# Patient Record
Sex: Male | Born: 1955 | Race: White | Hispanic: No | Marital: Married
Health system: Southern US, Community
[De-identification: ages and names within clinical notes are randomized; demographics above are authoritative.]

## PROBLEM LIST (undated history)

## (undated) DIAGNOSIS — I10 Essential (primary) hypertension: Secondary | ICD-10-CM

---

## 2006-04-27 ENCOUNTER — Other Ambulatory Visit: Payer: Self-pay

## 2006-04-27 ENCOUNTER — Inpatient Hospital Stay: Payer: Self-pay | Admitting: Internal Medicine

## 2006-05-10 ENCOUNTER — Other Ambulatory Visit: Payer: Self-pay

## 2006-05-10 ENCOUNTER — Ambulatory Visit: Payer: Self-pay | Admitting: Internal Medicine

## 2008-02-14 ENCOUNTER — Ambulatory Visit: Payer: Self-pay | Admitting: Family Medicine

## 2009-06-24 ENCOUNTER — Ambulatory Visit: Payer: Self-pay | Admitting: Family Medicine

## 2011-04-08 ENCOUNTER — Inpatient Hospital Stay: Payer: Self-pay | Admitting: Internal Medicine

## 2011-09-03 ENCOUNTER — Other Ambulatory Visit (HOSPITAL_COMMUNITY): Payer: Self-pay | Admitting: Family Medicine

## 2011-09-08 ENCOUNTER — Other Ambulatory Visit (HOSPITAL_COMMUNITY): Payer: Self-pay

## 2011-09-17 ENCOUNTER — Other Ambulatory Visit (HOSPITAL_COMMUNITY): Payer: Self-pay

## 2011-09-24 ENCOUNTER — Other Ambulatory Visit (HOSPITAL_COMMUNITY): Payer: Self-pay | Admitting: Family Medicine

## 2011-09-24 ENCOUNTER — Encounter (HOSPITAL_COMMUNITY): Payer: Self-pay

## 2011-09-24 ENCOUNTER — Ambulatory Visit (HOSPITAL_COMMUNITY)
Admission: RE | Admit: 2011-09-24 | Discharge: 2011-09-24 | Disposition: A | Discharge status: Home / Self Care | Payer: BC Managed Care – PPO | Source: Ambulatory Visit | Attending: Family Medicine | Admitting: Family Medicine

## 2011-09-24 DIAGNOSIS — R51 Headache: Secondary | ICD-10-CM | POA: Insufficient documentation

## 2011-09-24 DIAGNOSIS — R55 Syncope and collapse: Secondary | ICD-10-CM | POA: Insufficient documentation

## 2011-09-24 HISTORY — DX: Essential (primary) hypertension: I10

## 2011-09-24 LAB — RENAL FUNCTION PANEL
Calcium: 9.6 mg/dL (ref 8.4–10.5)
GFR calc Af Amer: 69 mL/min — ABNORMAL LOW (ref >90)
GFR calc non Af Amer: 59 mL/min — ABNORMAL LOW (ref >90)
Phosphorus: 3 mg/dL (ref 2.3–4.6)

## 2011-09-24 MED ORDER — IOHEXOL 300 MG/ML  SOLN
80.0000 mL | Freq: Once | INTRAMUSCULAR | Status: AC | PRN
  Administered 2011-09-24: 80 mL via INTRAVENOUS

## 2011-12-23 ENCOUNTER — Inpatient Hospital Stay: Payer: Self-pay | Admitting: Family Medicine

## 2011-12-23 LAB — URINALYSIS, COMPLETE
Bacteria: NONE SEEN
Bilirubin,UR: NEGATIVE
Blood: NEGATIVE
Glucose,UR: NEGATIVE mg/dL (ref 0–75)
Granular Cast: 48
Ketone: NEGATIVE
Nitrite: NEGATIVE
Ph: 5 (ref 4.5–8.0)
Specific Gravity: 1.015 (ref 1.003–1.030)
Squamous Epithelial: <1

## 2011-12-23 LAB — PROTIME-INR
INR: 1.9
Prothrombin Time: 22 secs — ABNORMAL HIGH (ref 11.5–14.7)

## 2011-12-23 LAB — COMPREHENSIVE METABOLIC PANEL
Anion Gap: 14 (ref 7–16)
Calcium, Total: 8.8 mg/dL (ref 8.5–10.1)
Chloride: 101 mmol/L (ref 98–107)
Co2: 21 mmol/L (ref 21–32)
EGFR (Non-African Amer.): 49 — ABNORMAL LOW
Osmolality: 284 (ref 275–301)
Potassium: 4.1 mmol/L (ref 3.5–5.1)
SGOT(AST): 23 U/L (ref 15–37)
Total Protein: 7.3 g/dL (ref 6.4–8.2)

## 2011-12-23 LAB — CBC WITH DIFFERENTIAL/PLATELET
Bands: 1 %
HCT: 44.9 % (ref 40.0–52.0)
Lymphocytes: 12 %
MCH: 27 pg (ref 26.0–34.0)
MCHC: 32.4 g/dL (ref 32.0–36.0)
Platelet: 518 10*3/uL — ABNORMAL HIGH (ref 150–440)
RDW: 14.8 % — ABNORMAL HIGH (ref 11.5–14.5)
Segmented Neutrophils: 76 %
WBC: 19.5 10*3/uL — ABNORMAL HIGH (ref 3.8–10.6)

## 2011-12-23 LAB — TROPONIN I
Troponin-I: <0.02 ng/mL
Troponin-I: <0.02 ng/mL

## 2011-12-23 LAB — APTT: Activated PTT: 42.4 secs — ABNORMAL HIGH (ref 23.6–35.9)

## 2011-12-23 LAB — CK TOTAL AND CKMB (NOT AT ARMC): CK, Total: 649 U/L — ABNORMAL HIGH (ref 35–232)

## 2011-12-23 LAB — PRO B NATRIURETIC PEPTIDE: B-Type Natriuretic Peptide: 1451 pg/mL — ABNORMAL HIGH (ref 0–125)

## 2011-12-24 LAB — TROPONIN I: Troponin-I: <0.02 ng/mL

## 2011-12-24 LAB — COMPREHENSIVE METABOLIC PANEL
Albumin: 2.1 g/dL — ABNORMAL LOW (ref 3.4–5.0)
Alkaline Phosphatase: 91 U/L (ref 50–136)
Anion Gap: 10 (ref 7–16)
Chloride: 100 mmol/L (ref 98–107)
Co2: 26 mmol/L (ref 21–32)
Creatinine: 1.67 mg/dL — ABNORMAL HIGH (ref 0.60–1.30)
EGFR (African American): 52 — ABNORMAL LOW
EGFR (Non-African Amer.): 45 — ABNORMAL LOW
Glucose: 288 mg/dL — ABNORMAL HIGH (ref 65–99)
SGPT (ALT): 25 U/L
Sodium: 136 mmol/L (ref 136–145)
Total Protein: 6.5 g/dL (ref 6.4–8.2)

## 2011-12-24 LAB — CK TOTAL AND CKMB (NOT AT ARMC)
CK, Total: 792 U/L — ABNORMAL HIGH (ref 35–232)
CK-MB: 7.1 ng/mL — ABNORMAL HIGH (ref 0.5–3.6)

## 2011-12-24 LAB — CBC WITH DIFFERENTIAL/PLATELET
Basophil #: 0.3 10*3/uL — ABNORMAL HIGH (ref 0.0–0.1)
Basophil %: 1.5 %
Eosinophil %: 0.8 %
HCT: 41.5 % (ref 40.0–52.0)
HGB: 13.2 g/dL (ref 13.0–18.0)
Lymphocyte #: 1.6 10*3/uL (ref 1.0–3.6)
Lymphocyte %: 8.7 %
MCV: 84 fL (ref 80–100)
Monocyte #: 1.8 x10 3/mm — ABNORMAL HIGH (ref 0.2–1.0)
Monocyte %: 9.8 %
Platelet: 475 10*3/uL — ABNORMAL HIGH (ref 150–440)
RBC: 4.96 10*6/uL (ref 4.40–5.90)
RDW: 14.8 % — ABNORMAL HIGH (ref 11.5–14.5)
WBC: 18.5 10*3/uL — ABNORMAL HIGH (ref 3.8–10.6)

## 2011-12-24 LAB — MAGNESIUM: Magnesium: 1.5 mg/dL — ABNORMAL LOW

## 2011-12-25 LAB — CBC WITH DIFFERENTIAL/PLATELET
Basophil #: 0.3 10*3/uL — ABNORMAL HIGH (ref 0.0–0.1)
Basophil %: 1.3 %
Eosinophil %: 0.9 %
HCT: 41.3 % (ref 40.0–52.0)
HGB: 13.2 g/dL (ref 13.0–18.0)
MCH: 26.8 pg (ref 26.0–34.0)
MCHC: 32 g/dL (ref 32.0–36.0)
MCV: 84 fL (ref 80–100)
Neutrophil #: 16.1 10*3/uL — ABNORMAL HIGH (ref 1.4–6.5)
Platelet: 488 10*3/uL — ABNORMAL HIGH (ref 150–440)
RBC: 4.92 10*6/uL (ref 4.40–5.90)

## 2011-12-25 LAB — BASIC METABOLIC PANEL
Anion Gap: 9 (ref 7–16)
Calcium, Total: 9 mg/dL (ref 8.5–10.1)
Chloride: 98 mmol/L (ref 98–107)
Co2: 28 mmol/L (ref 21–32)
EGFR (African American): 54 — ABNORMAL LOW
EGFR (Non-African Amer.): 47 — ABNORMAL LOW
Glucose: 236 mg/dL — ABNORMAL HIGH (ref 65–99)
Osmolality: 287 (ref 275–301)
Sodium: 135 mmol/L — ABNORMAL LOW (ref 136–145)

## 2011-12-25 LAB — PROTIME-INR: Prothrombin Time: 32 secs — ABNORMAL HIGH (ref 11.5–14.7)

## 2011-12-26 LAB — BASIC METABOLIC PANEL
Anion Gap: 7 (ref 7–16)
Calcium, Total: 8.8 mg/dL (ref 8.5–10.1)
Chloride: 99 mmol/L (ref 98–107)
Co2: 27 mmol/L (ref 21–32)
EGFR (African American): 59 — ABNORMAL LOW
Glucose: 184 mg/dL — ABNORMAL HIGH (ref 65–99)
Potassium: 4.3 mmol/L (ref 3.5–5.1)

## 2011-12-26 LAB — CBC WITH DIFFERENTIAL/PLATELET
Basophil #: 0.2 10*3/uL — ABNORMAL HIGH (ref 0.0–0.1)
Basophil %: 1.1 %
Eosinophil #: 0.3 10*3/uL (ref 0.0–0.7)
Eosinophil %: 1.6 %
HCT: 41.6 % (ref 40.0–52.0)
Lymphocyte #: 1.6 10*3/uL (ref 1.0–3.6)
MCH: 26.5 pg (ref 26.0–34.0)
MCV: 83 fL (ref 80–100)
Monocyte #: 2.1 x10 3/mm — ABNORMAL HIGH (ref 0.2–1.0)
Neutrophil #: 16.1 10*3/uL — ABNORMAL HIGH (ref 1.4–6.5)
Platelet: 524 10*3/uL — ABNORMAL HIGH (ref 150–440)
RBC: 4.99 10*6/uL (ref 4.40–5.90)
RDW: 14.5 % (ref 11.5–14.5)

## 2011-12-26 LAB — PROTIME-INR
INR: 3
Prothrombin Time: 31.5 secs — ABNORMAL HIGH (ref 11.5–14.7)

## 2011-12-26 LAB — VANCOMYCIN, TROUGH: Vancomycin, Trough: 17 ug/mL (ref 10–20)

## 2011-12-27 LAB — BASIC METABOLIC PANEL
Anion Gap: 8 (ref 7–16)
Calcium, Total: 8.5 mg/dL (ref 8.5–10.1)
Chloride: 96 mmol/L — ABNORMAL LOW (ref 98–107)
Co2: 26 mmol/L (ref 21–32)
Creatinine: 1.59 mg/dL — ABNORMAL HIGH (ref 0.60–1.30)
Glucose: 247 mg/dL — ABNORMAL HIGH (ref 65–99)
Osmolality: 279 (ref 275–301)
Potassium: 4.5 mmol/L (ref 3.5–5.1)

## 2011-12-27 LAB — CBC WITH DIFFERENTIAL/PLATELET
Basophil %: 1.4 %
Eosinophil %: 1.6 %
HGB: 12.9 g/dL — ABNORMAL LOW (ref 13.0–18.0)
Lymphocyte #: 1.3 10*3/uL (ref 1.0–3.6)
MCH: 26.6 pg (ref 26.0–34.0)
MCV: 83 fL (ref 80–100)
Monocyte #: 1.8 x10 3/mm — ABNORMAL HIGH (ref 0.2–1.0)
Monocyte %: 9.8 %
Neutrophil #: 15.3 10*3/uL — ABNORMAL HIGH (ref 1.4–6.5)
Neutrophil %: 80.5 %
Platelet: 517 10*3/uL — ABNORMAL HIGH (ref 150–440)

## 2011-12-27 LAB — OSMOLALITY, URINE: Osmolality: 413 mOsm/kg

## 2011-12-27 LAB — PROTIME-INR
INR: 2.3
Prothrombin Time: 25.3 secs — ABNORMAL HIGH (ref 11.5–14.7)

## 2011-12-28 LAB — CBC WITH DIFFERENTIAL/PLATELET
Eosinophil %: 1.9 %
HCT: 38.6 % — ABNORMAL LOW (ref 40.0–52.0)
Lymphocyte #: 1.3 10*3/uL (ref 1.0–3.6)
MCV: 83 fL (ref 80–100)
Monocyte %: 9.9 %
Neutrophil %: 80.5 %
Platelet: 552 10*3/uL — ABNORMAL HIGH (ref 150–440)
RBC: 4.66 10*6/uL (ref 4.40–5.90)
WBC: 19.7 10*3/uL — ABNORMAL HIGH (ref 3.8–10.6)

## 2011-12-28 LAB — BASIC METABOLIC PANEL
Anion Gap: 10 (ref 7–16)
Calcium, Total: 8.7 mg/dL (ref 8.5–10.1)
Chloride: 95 mmol/L — ABNORMAL LOW (ref 98–107)
Creatinine: 1.53 mg/dL — ABNORMAL HIGH (ref 0.60–1.30)
EGFR (African American): 58 — ABNORMAL LOW
EGFR (Non-African Amer.): 50 — ABNORMAL LOW
Glucose: 228 mg/dL — ABNORMAL HIGH (ref 65–99)
Osmolality: 277 (ref 275–301)
Potassium: 4.9 mmol/L (ref 3.5–5.1)
Sodium: 129 mmol/L — ABNORMAL LOW (ref 136–145)

## 2011-12-28 LAB — PROTIME-INR: INR: 1.8

## 2011-12-28 LAB — CULTURE, BLOOD (SINGLE)

## 2011-12-29 LAB — CBC WITH DIFFERENTIAL/PLATELET
Basophil #: 0.2 10*3/uL — ABNORMAL HIGH (ref 0.0–0.1)
HGB: 12.1 g/dL — ABNORMAL LOW (ref 13.0–18.0)
Lymphocyte #: 1.2 10*3/uL (ref 1.0–3.6)
Lymphocyte %: 6.1 %
MCH: 27.1 pg (ref 26.0–34.0)
MCV: 83 fL (ref 80–100)
Monocyte %: 9.7 %
Neutrophil #: 15.5 10*3/uL — ABNORMAL HIGH (ref 1.4–6.5)
Platelet: 550 10*3/uL — ABNORMAL HIGH (ref 150–440)
RBC: 4.46 10*6/uL (ref 4.40–5.90)
WBC: 19 10*3/uL — ABNORMAL HIGH (ref 3.8–10.6)

## 2011-12-29 LAB — MAGNESIUM: Magnesium: 2.1 mg/dL

## 2011-12-29 LAB — PROTIME-INR
INR: 1.9
Prothrombin Time: 22.3 secs — ABNORMAL HIGH (ref 11.5–14.7)

## 2011-12-29 LAB — CREATININE, SERUM: Creatinine: 1.45 mg/dL — ABNORMAL HIGH (ref 0.60–1.30)

## 2011-12-30 LAB — CBC WITH DIFFERENTIAL/PLATELET
Basophil %: 0.7 %
Eosinophil #: 0.3 10*3/uL (ref 0.0–0.7)
HGB: 11.9 g/dL — ABNORMAL LOW (ref 13.0–18.0)
Lymphocyte #: 1.5 10*3/uL (ref 1.0–3.6)
Lymphocyte %: 7.5 %
MCH: 26.4 pg (ref 26.0–34.0)
MCHC: 31.6 g/dL — ABNORMAL LOW (ref 32.0–36.0)
MCV: 83 fL (ref 80–100)
Monocyte %: 10.2 %
Neutrophil #: 15.5 10*3/uL — ABNORMAL HIGH (ref 1.4–6.5)
RDW: 14.9 % — ABNORMAL HIGH (ref 11.5–14.5)
WBC: 19.4 10*3/uL — ABNORMAL HIGH (ref 3.8–10.6)

## 2011-12-30 LAB — PROTIME-INR
INR: 2
Prothrombin Time: 23.1 secs — ABNORMAL HIGH (ref 11.5–14.7)

## 2012-12-25 ENCOUNTER — Inpatient Hospital Stay: Payer: Self-pay | Admitting: Internal Medicine

## 2012-12-25 LAB — URINALYSIS, COMPLETE
Bacteria: NONE SEEN
Bilirubin,UR: NEGATIVE
Glucose,UR: >=500 mg/dL (ref 0–75)
Hyaline Cast: 8
Leukocyte Esterase: NEGATIVE
Nitrite: NEGATIVE
Ph: 5 (ref 4.5–8.0)
Specific Gravity: 1.026 (ref 1.003–1.030)
Squamous Epithelial: <1
WBC UR: 10 /HPF (ref 0–5)

## 2012-12-25 LAB — PROTIME-INR: INR: 1.8

## 2012-12-25 LAB — CBC: WBC: 18.6 10*3/uL — ABNORMAL HIGH (ref 3.8–10.6)

## 2012-12-25 LAB — COMPREHENSIVE METABOLIC PANEL
Albumin: 2.1 g/dL — ABNORMAL LOW (ref 3.4–5.0)
Alkaline Phosphatase: 117 U/L (ref 50–136)
BUN: 21 mg/dL — ABNORMAL HIGH (ref 7–18)
Bilirubin,Total: 0.4 mg/dL (ref 0.2–1.0)
Calcium, Total: 9 mg/dL (ref 8.5–10.1)
Chloride: 101 mmol/L (ref 98–107)
Co2: 30 mmol/L (ref 21–32)
Creatinine: 1.41 mg/dL — ABNORMAL HIGH (ref 0.60–1.30)
EGFR (African American): >60
Osmolality: 286 (ref 275–301)
Potassium: 4 mmol/L (ref 3.5–5.1)
SGOT(AST): 12 U/L — ABNORMAL LOW (ref 15–37)
Total Protein: 7 g/dL (ref 6.4–8.2)

## 2012-12-25 LAB — PRO B NATRIURETIC PEPTIDE: B-Type Natriuretic Peptide: 5083 pg/mL — ABNORMAL HIGH (ref 0–125)

## 2012-12-26 LAB — BASIC METABOLIC PANEL
Anion Gap: 4 — ABNORMAL LOW (ref 7–16)
Co2: 33 mmol/L — ABNORMAL HIGH (ref 21–32)
EGFR (African American): >60
Potassium: 4 mmol/L (ref 3.5–5.1)

## 2012-12-26 LAB — CBC WITH DIFFERENTIAL/PLATELET
Basophil #: 0.2 10*3/uL — ABNORMAL HIGH (ref 0.0–0.1)
Eosinophil #: 0.1 10*3/uL (ref 0.0–0.7)
HGB: 13 g/dL (ref 13.0–18.0)
Lymphocyte #: 1.5 10*3/uL (ref 1.0–3.6)
Lymphocyte %: 7 %
Neutrophil #: 18 10*3/uL — ABNORMAL HIGH (ref 1.4–6.5)
Neutrophil %: 84.6 %
Platelet: 500 10*3/uL — ABNORMAL HIGH (ref 150–440)
RBC: 5.38 10*6/uL (ref 4.40–5.90)
WBC: 21.3 10*3/uL — ABNORMAL HIGH (ref 3.8–10.6)

## 2012-12-26 LAB — MAGNESIUM: Magnesium: 1.5 mg/dL — ABNORMAL LOW

## 2012-12-26 LAB — HEMOGLOBIN A1C: Hemoglobin A1C: 11.6 % — ABNORMAL HIGH (ref 4.2–6.3)

## 2012-12-27 LAB — CREATININE, SERUM
Creatinine: 2.78 mg/dL — ABNORMAL HIGH (ref 0.60–1.30)
EGFR (African American): 28 — ABNORMAL LOW
EGFR (Non-African Amer.): 24 — ABNORMAL LOW

## 2012-12-28 LAB — BASIC METABOLIC PANEL
Anion Gap: 6 — ABNORMAL LOW (ref 7–16)
Chloride: 95 mmol/L — ABNORMAL LOW (ref 98–107)
EGFR (African American): 18 — ABNORMAL LOW
EGFR (Non-African Amer.): 15 — ABNORMAL LOW
Glucose: 258 mg/dL — ABNORMAL HIGH (ref 65–99)
Osmolality: 283 (ref 275–301)
Potassium: 4.6 mmol/L (ref 3.5–5.1)
Sodium: 131 mmol/L — ABNORMAL LOW (ref 136–145)

## 2012-12-28 LAB — CBC WITH DIFFERENTIAL/PLATELET
Basophil #: 0.1 10*3/uL (ref 0.0–0.1)
Basophil %: 0.7 %
Eosinophil #: 0.5 10*3/uL (ref 0.0–0.7)
HGB: 11.9 g/dL — ABNORMAL LOW (ref 13.0–18.0)
Lymphocyte %: 4.4 %
MCH: 24.5 pg — ABNORMAL LOW (ref 26.0–34.0)
MCHC: 31.8 g/dL — ABNORMAL LOW (ref 32.0–36.0)
MCV: 77 fL — ABNORMAL LOW (ref 80–100)
Neutrophil #: 15.9 10*3/uL — ABNORMAL HIGH (ref 1.4–6.5)
Neutrophil %: 84.3 %
Platelet: 501 10*3/uL — ABNORMAL HIGH (ref 150–440)
RDW: 16.3 % — ABNORMAL HIGH (ref 11.5–14.5)

## 2012-12-28 LAB — VANCOMYCIN, TROUGH: Vancomycin, Trough: 22 ug/mL (ref 10–20)

## 2012-12-28 LAB — PROTIME-INR
INR: 1.6
Prothrombin Time: 19.3 secs — ABNORMAL HIGH (ref 11.5–14.7)

## 2012-12-28 LAB — PROTEIN / CREATININE RATIO, URINE
Creatinine, Urine: 174.1 mg/dL — ABNORMAL HIGH (ref 30.0–125.0)
Protein, Random Urine: 75 mg/dL — ABNORMAL HIGH (ref 0–12)

## 2012-12-28 LAB — URINALYSIS, COMPLETE
Bilirubin,UR: NEGATIVE
Ph: 5 (ref 4.5–8.0)
RBC,UR: 6 /HPF (ref 0–5)
Specific Gravity: 1.017 (ref 1.003–1.030)
WBC UR: 82 /HPF (ref 0–5)

## 2012-12-28 LAB — MAGNESIUM: Magnesium: 2.1 mg/dL

## 2012-12-29 LAB — BASIC METABOLIC PANEL
Co2: 23 mmol/L (ref 21–32)
Creatinine: 4.32 mg/dL — ABNORMAL HIGH (ref 0.60–1.30)
EGFR (African American): 16 — ABNORMAL LOW
EGFR (Non-African Amer.): 14 — ABNORMAL LOW
Glucose: 232 mg/dL — ABNORMAL HIGH (ref 65–99)
Sodium: 134 mmol/L — ABNORMAL LOW (ref 136–145)

## 2012-12-29 LAB — PROTIME-INR: INR: 1.4

## 2012-12-29 LAB — VANCOMYCIN, TROUGH
Vancomycin, Trough: 23 ug/mL (ref 10–20)
Vancomycin, Trough: 23 ug/mL (ref 10–20)

## 2012-12-30 LAB — CULTURE, BLOOD (SINGLE)

## 2012-12-30 LAB — CREATININE, SERUM
Creatinine: 4.64 mg/dL — ABNORMAL HIGH (ref 0.60–1.30)
EGFR (African American): 15 — ABNORMAL LOW

## 2012-12-30 LAB — PROTIME-INR: Prothrombin Time: 16.9 secs — ABNORMAL HIGH (ref 11.5–14.7)

## 2012-12-31 LAB — BASIC METABOLIC PANEL
Anion Gap: 5 — ABNORMAL LOW (ref 7–16)
Calcium, Total: 8.6 mg/dL (ref 8.5–10.1)
Co2: 29 mmol/L (ref 21–32)
Creatinine: 4.64 mg/dL — ABNORMAL HIGH (ref 0.60–1.30)
Osmolality: 289 (ref 275–301)
Potassium: 4.8 mmol/L (ref 3.5–5.1)

## 2013-01-01 LAB — BASIC METABOLIC PANEL
Anion Gap: 7 (ref 7–16)
BUN: 68 mg/dL — ABNORMAL HIGH (ref 7–18)
Calcium, Total: 8.8 mg/dL (ref 8.5–10.1)
Chloride: 98 mmol/L (ref 98–107)
Co2: 27 mmol/L (ref 21–32)
EGFR (African American): 14 — ABNORMAL LOW
Glucose: 205 mg/dL — ABNORMAL HIGH (ref 65–99)
Osmolality: 290 (ref 275–301)
Potassium: 5.1 mmol/L (ref 3.5–5.1)
Sodium: 132 mmol/L — ABNORMAL LOW (ref 136–145)

## 2013-01-01 LAB — PROTIME-INR: Prothrombin Time: 17 secs — ABNORMAL HIGH (ref 11.5–14.7)

## 2013-01-02 LAB — RENAL FUNCTION PANEL
Albumin: 1.5 g/dL — ABNORMAL LOW (ref 3.4–5.0)
Anion Gap: 6 — ABNORMAL LOW (ref 7–16)
Calcium, Total: 8.2 mg/dL — ABNORMAL LOW (ref 8.5–10.1)
Calcium, Total: 8.7 mg/dL (ref 8.5–10.1)
Chloride: 99 mmol/L (ref 98–107)
Chloride: 99 mmol/L (ref 98–107)
Co2: 25 mmol/L (ref 21–32)
Co2: 27 mmol/L (ref 21–32)
Creatinine: 4.94 mg/dL — ABNORMAL HIGH (ref 0.60–1.30)
Creatinine: 4.93 mg/dL — ABNORMAL HIGH (ref 0.60–1.30)
EGFR (African American): 14 — ABNORMAL LOW
EGFR (Non-African Amer.): 12 — ABNORMAL LOW
Glucose: 204 mg/dL — ABNORMAL HIGH (ref 65–99)
Glucose: 253 mg/dL — ABNORMAL HIGH (ref 65–99)
Phosphorus: 6.2 mg/dL — ABNORMAL HIGH (ref 2.5–4.9)
Phosphorus: 5.5 mg/dL — ABNORMAL HIGH (ref 2.5–4.9)
Potassium: 5.1 mmol/L (ref 3.5–5.1)

## 2013-01-02 LAB — CREATININE, SERUM: Creatinine: 5.03 mg/dL — ABNORMAL HIGH (ref 0.60–1.30)

## 2013-01-03 LAB — CBC WITH DIFFERENTIAL/PLATELET
Basophil #: 0.2 10*3/uL — ABNORMAL HIGH (ref 0.0–0.1)
Basophil %: 1.3 %
Eosinophil #: 0.5 10*3/uL (ref 0.0–0.7)
HGB: 11.4 g/dL — ABNORMAL LOW (ref 13.0–18.0)
Lymphocyte #: 0.8 10*3/uL — ABNORMAL LOW (ref 1.0–3.6)
MCV: 77 fL — ABNORMAL LOW (ref 80–100)
Monocyte #: 1 x10 3/mm (ref 0.2–1.0)
Monocyte %: 7.4 %
Neutrophil %: 81.7 %
Platelet: 445 10*3/uL — ABNORMAL HIGH (ref 150–440)
RBC: 4.75 10*6/uL (ref 4.40–5.90)
WBC: 12.9 10*3/uL — ABNORMAL HIGH (ref 3.8–10.6)

## 2013-01-03 LAB — PROTIME-INR
INR: 1.8
Prothrombin Time: 20.4 secs — ABNORMAL HIGH (ref 11.5–14.7)

## 2013-01-03 LAB — RENAL FUNCTION PANEL
Albumin: 1.5 g/dL — ABNORMAL LOW (ref 3.4–5.0)
BUN: 71 mg/dL — ABNORMAL HIGH (ref 7–18)
Calcium, Total: 8.7 mg/dL (ref 8.5–10.1)
Chloride: 99 mmol/L (ref 98–107)
Co2: 28 mmol/L (ref 21–32)
Creatinine: 5.01 mg/dL — ABNORMAL HIGH (ref 0.60–1.30)
EGFR (African American): 14 — ABNORMAL LOW
EGFR (Non-African Amer.): 12 — ABNORMAL LOW
Glucose: 197 mg/dL — ABNORMAL HIGH (ref 65–99)
Osmolality: 291 (ref 275–301)
Potassium: 5.3 mmol/L — ABNORMAL HIGH (ref 3.5–5.1)

## 2013-01-03 LAB — SEDIMENTATION RATE: Erythrocyte Sed Rate: 11 mm/hr (ref 0–20)

## 2013-01-04 LAB — RENAL FUNCTION PANEL
Albumin: 1.5 g/dL — ABNORMAL LOW (ref 3.4–5.0)
Anion Gap: 6 — ABNORMAL LOW (ref 7–16)
Calcium, Total: 8.7 mg/dL (ref 8.5–10.1)
Chloride: 101 mmol/L (ref 98–107)
Co2: 26 mmol/L (ref 21–32)
EGFR (African American): 15 — ABNORMAL LOW
EGFR (Non-African Amer.): 13 — ABNORMAL LOW
Glucose: 249 mg/dL — ABNORMAL HIGH (ref 65–99)
Osmolality: 297 (ref 275–301)
Potassium: 5.4 mmol/L — ABNORMAL HIGH (ref 3.5–5.1)

## 2013-01-04 LAB — CLOSTRIDIUM DIFFICILE BY PCR

## 2013-01-05 LAB — BASIC METABOLIC PANEL
Anion Gap: 6 — ABNORMAL LOW (ref 7–16)
BUN: 75 mg/dL — ABNORMAL HIGH (ref 7–18)
Calcium, Total: 8.9 mg/dL (ref 8.5–10.1)
Co2: 26 mmol/L (ref 21–32)
Glucose: 152 mg/dL — ABNORMAL HIGH (ref 65–99)

## 2013-01-05 LAB — PROTIME-INR: Prothrombin Time: 23 secs — ABNORMAL HIGH (ref 11.5–14.7)

## 2013-01-06 LAB — BASIC METABOLIC PANEL
Anion Gap: 7 (ref 7–16)
BUN: 80 mg/dL — ABNORMAL HIGH (ref 7–18)
Co2: 27 mmol/L (ref 21–32)
EGFR (African American): 15 — ABNORMAL LOW
EGFR (Non-African Amer.): 13 — ABNORMAL LOW
Osmolality: 301 (ref 275–301)
Potassium: 5.4 mmol/L — ABNORMAL HIGH (ref 3.5–5.1)
Sodium: 137 mmol/L (ref 136–145)

## 2013-01-06 LAB — PROTIME-INR: Prothrombin Time: 25.5 secs — ABNORMAL HIGH (ref 11.5–14.7)

## 2013-01-07 LAB — BASIC METABOLIC PANEL
Anion Gap: 6 — ABNORMAL LOW (ref 7–16)
BUN: 79 mg/dL — ABNORMAL HIGH (ref 7–18)
Chloride: 104 mmol/L (ref 98–107)
Creatinine: 4.4 mg/dL — ABNORMAL HIGH (ref 0.60–1.30)
EGFR (African American): 16 — ABNORMAL LOW
Glucose: 190 mg/dL — ABNORMAL HIGH (ref 65–99)
Osmolality: 303 (ref 275–301)
Potassium: 5.2 mmol/L — ABNORMAL HIGH (ref 3.5–5.1)

## 2013-01-07 LAB — PROTIME-INR: Prothrombin Time: 28.6 secs — ABNORMAL HIGH (ref 11.5–14.7)

## 2013-01-08 LAB — CBC WITH DIFFERENTIAL/PLATELET
Eosinophil #: 0.4 10*3/uL (ref 0.0–0.7)
HGB: 11.2 g/dL — ABNORMAL LOW (ref 13.0–18.0)
Lymphocyte #: 1 10*3/uL (ref 1.0–3.6)
Lymphocyte %: 8.9 %
MCH: 24.2 pg — ABNORMAL LOW (ref 26.0–34.0)
MCHC: 31.5 g/dL — ABNORMAL LOW (ref 32.0–36.0)
MCV: 77 fL — ABNORMAL LOW (ref 80–100)
Monocyte %: 9 %
Neutrophil %: 77.3 %
Platelet: 391 10*3/uL (ref 150–440)
RBC: 4.64 10*6/uL (ref 4.40–5.90)
WBC: 11 10*3/uL — ABNORMAL HIGH (ref 3.8–10.6)

## 2013-01-08 LAB — PROTIME-INR
INR: 3.1
Prothrombin Time: 31.1 secs — ABNORMAL HIGH (ref 11.5–14.7)

## 2013-01-08 LAB — BASIC METABOLIC PANEL
Anion Gap: 4 — ABNORMAL LOW (ref 7–16)
BUN: 71 mg/dL — ABNORMAL HIGH (ref 7–18)
Calcium, Total: 8.5 mg/dL (ref 8.5–10.1)
Co2: 29 mmol/L (ref 21–32)
Creatinine: 4.17 mg/dL — ABNORMAL HIGH (ref 0.60–1.30)
EGFR (Non-African Amer.): 15 — ABNORMAL LOW
Osmolality: 301 (ref 275–301)
Potassium: 5 mmol/L (ref 3.5–5.1)

## 2013-01-09 LAB — MAGNESIUM: Magnesium: 1.6 mg/dL — ABNORMAL LOW

## 2013-01-09 LAB — BASIC METABOLIC PANEL
Anion Gap: 7 (ref 7–16)
BUN: 68 mg/dL — ABNORMAL HIGH (ref 7–18)
Chloride: 105 mmol/L (ref 98–107)
Co2: 27 mmol/L (ref 21–32)
Glucose: 138 mg/dL — ABNORMAL HIGH (ref 65–99)
Osmolality: 299 (ref 275–301)
Potassium: 4.8 mmol/L (ref 3.5–5.1)
Sodium: 139 mmol/L (ref 136–145)

## 2013-01-09 LAB — PROTIME-INR
INR: 4.4
Prothrombin Time: 39.9 secs — ABNORMAL HIGH (ref 11.5–14.7)

## 2013-01-10 LAB — BASIC METABOLIC PANEL
Creatinine: 3.45 mg/dL — ABNORMAL HIGH (ref 0.60–1.30)
EGFR (African American): 22 — ABNORMAL LOW
EGFR (Non-African Amer.): 19 — ABNORMAL LOW
Glucose: 115 mg/dL — ABNORMAL HIGH (ref 65–99)
Osmolality: 296 (ref 275–301)

## 2013-01-10 LAB — MAGNESIUM: Magnesium: 1.8 mg/dL

## 2013-01-11 LAB — BASIC METABOLIC PANEL
Anion Gap: 6 — ABNORMAL LOW (ref 7–16)
BUN: 56 mg/dL — ABNORMAL HIGH (ref 7–18)
Calcium, Total: 8.8 mg/dL (ref 8.5–10.1)
Chloride: 106 mmol/L (ref 98–107)
Creatinine: 3.24 mg/dL — ABNORMAL HIGH (ref 0.60–1.30)

## 2013-01-11 LAB — PROTIME-INR: INR: 3.9

## 2013-01-12 LAB — BASIC METABOLIC PANEL
Anion Gap: 3 — ABNORMAL LOW (ref 7–16)
BUN: 51 mg/dL — ABNORMAL HIGH (ref 7–18)
Calcium, Total: 8.7 mg/dL (ref 8.5–10.1)
Chloride: 105 mmol/L (ref 98–107)
Creatinine: 2.92 mg/dL — ABNORMAL HIGH (ref 0.60–1.30)
EGFR (African American): 26 — ABNORMAL LOW
EGFR (Non-African Amer.): 23 — ABNORMAL LOW
Osmolality: 293 (ref 275–301)
Sodium: 139 mmol/L (ref 136–145)

## 2013-10-02 ENCOUNTER — Inpatient Hospital Stay: Payer: Self-pay | Admitting: Internal Medicine

## 2013-10-02 LAB — URINALYSIS, COMPLETE
Bilirubin,UR: NEGATIVE
Glucose,UR: NEGATIVE mg/dL (ref 0–75)
Hyaline Cast: 2
Ketone: NEGATIVE
Nitrite: POSITIVE
Ph: 5 (ref 4.5–8.0)
Protein: 100
RBC,UR: 1 /HPF (ref 0–5)
Specific Gravity: 1.013 (ref 1.003–1.030)
Squamous Epithelial: NONE SEEN
WBC UR: 22 /HPF (ref 0–5)

## 2013-10-02 LAB — CBC
HCT: 40.4 % (ref 40.0–52.0)
HGB: 12.6 g/dL — ABNORMAL LOW (ref 13.0–18.0)
MCH: 25.2 pg — AB (ref 26.0–34.0)
MCHC: 31.2 g/dL — AB (ref 32.0–36.0)
MCV: 81 fL (ref 80–100)
Platelet: 332 10*3/uL (ref 150–440)
RBC: 5.01 10*6/uL (ref 4.40–5.90)
RDW: 16.5 % — ABNORMAL HIGH (ref 11.5–14.5)
WBC: 8.7 10*3/uL (ref 3.8–10.6)

## 2013-10-02 LAB — COMPREHENSIVE METABOLIC PANEL
ALK PHOS: 155 U/L — AB
ALT: 18 U/L (ref 12–78)
Albumin: 2.8 g/dL — ABNORMAL LOW (ref 3.4–5.0)
Anion Gap: 3 — ABNORMAL LOW (ref 7–16)
BILIRUBIN TOTAL: 1 mg/dL (ref 0.2–1.0)
BUN: 21 mg/dL — ABNORMAL HIGH (ref 7–18)
CALCIUM: 8.9 mg/dL (ref 8.5–10.1)
CHLORIDE: 107 mmol/L (ref 98–107)
Co2: 33 mmol/L — ABNORMAL HIGH (ref 21–32)
Creatinine: 1.54 mg/dL — ABNORMAL HIGH (ref 0.60–1.30)
EGFR (African American): 57 — ABNORMAL LOW
GFR CALC NON AF AMER: 49 — AB
Glucose: 165 mg/dL — ABNORMAL HIGH (ref 65–99)
OSMOLALITY: 292 (ref 275–301)
Potassium: 4.1 mmol/L (ref 3.5–5.1)
SGOT(AST): 22 U/L (ref 15–37)
SODIUM: 143 mmol/L (ref 136–145)
Total Protein: 7.4 g/dL (ref 6.4–8.2)

## 2013-10-02 LAB — PRO B NATRIURETIC PEPTIDE: B-TYPE NATIURETIC PEPTID: 8694 pg/mL — AB (ref 0–125)

## 2013-10-02 LAB — PROTIME-INR
INR: 1.2
PROTHROMBIN TIME: 14.7 s (ref 11.5–14.7)

## 2013-10-02 LAB — TROPONIN I

## 2013-10-03 DIAGNOSIS — I369 Nonrheumatic tricuspid valve disorder, unspecified: Secondary | ICD-10-CM

## 2013-10-03 LAB — BASIC METABOLIC PANEL
ANION GAP: 3 — AB (ref 7–16)
BUN: 21 mg/dL — ABNORMAL HIGH (ref 7–18)
CALCIUM: 8.9 mg/dL (ref 8.5–10.1)
CHLORIDE: 106 mmol/L (ref 98–107)
CO2: 33 mmol/L — AB (ref 21–32)
Creatinine: 1.37 mg/dL — ABNORMAL HIGH (ref 0.60–1.30)
EGFR (Non-African Amer.): 56 — ABNORMAL LOW
Glucose: 102 mg/dL — ABNORMAL HIGH (ref 65–99)
Osmolality: 286 (ref 275–301)
POTASSIUM: 4.2 mmol/L (ref 3.5–5.1)
Sodium: 142 mmol/L (ref 136–145)

## 2013-10-03 LAB — CBC WITH DIFFERENTIAL/PLATELET
BASOS ABS: 0.1 10*3/uL (ref 0.0–0.1)
Basophil %: 1.3 %
EOS ABS: 0.6 10*3/uL (ref 0.0–0.7)
EOS PCT: 5.7 %
HCT: 40 % (ref 40.0–52.0)
HGB: 12.5 g/dL — ABNORMAL LOW (ref 13.0–18.0)
LYMPHS PCT: 9.6 %
Lymphocyte #: 1.1 10*3/uL (ref 1.0–3.6)
MCH: 25.7 pg — ABNORMAL LOW (ref 26.0–34.0)
MCHC: 31.3 g/dL — AB (ref 32.0–36.0)
MCV: 82 fL (ref 80–100)
Monocyte #: 1 x10 3/mm (ref 0.2–1.0)
Monocyte %: 8.8 %
NEUTROS ABS: 8.4 10*3/uL — AB (ref 1.4–6.5)
Neutrophil %: 74.6 %
PLATELETS: 353 10*3/uL (ref 150–440)
RBC: 4.87 10*6/uL (ref 4.40–5.90)
RDW: 16.5 % — ABNORMAL HIGH (ref 11.5–14.5)
WBC: 11.2 10*3/uL — AB (ref 3.8–10.6)

## 2013-10-03 LAB — LIPID PANEL
CHOLESTEROL: 97 mg/dL (ref 0–200)
HDL: 34 mg/dL — AB (ref 40–60)
Ldl Cholesterol, Calc: 49 mg/dL (ref 0–100)
Triglycerides: 68 mg/dL (ref 0–200)
VLDL Cholesterol, Calc: 14 mg/dL (ref 5–40)

## 2013-10-03 LAB — TSH: Thyroid Stimulating Horm: 2.04 u[IU]/mL

## 2013-10-04 LAB — BASIC METABOLIC PANEL
ANION GAP: 4 — AB (ref 7–16)
BUN: 23 mg/dL — ABNORMAL HIGH (ref 7–18)
Calcium, Total: 8.9 mg/dL (ref 8.5–10.1)
Chloride: 103 mmol/L (ref 98–107)
Co2: 34 mmol/L — ABNORMAL HIGH (ref 21–32)
Creatinine: 1.7 mg/dL — ABNORMAL HIGH (ref 0.60–1.30)
EGFR (African American): 50 — ABNORMAL LOW
GFR CALC NON AF AMER: 43 — AB
GLUCOSE: 125 mg/dL — AB (ref 65–99)
OSMOLALITY: 286 (ref 275–301)
POTASSIUM: 4 mmol/L (ref 3.5–5.1)
Sodium: 141 mmol/L (ref 136–145)

## 2013-10-05 LAB — BASIC METABOLIC PANEL
ANION GAP: 5 — AB (ref 7–16)
BUN: 24 mg/dL — AB (ref 7–18)
CALCIUM: 8.9 mg/dL (ref 8.5–10.1)
CO2: 34 mmol/L — AB (ref 21–32)
CREATININE: 1.7 mg/dL — AB (ref 0.60–1.30)
Chloride: 100 mmol/L (ref 98–107)
EGFR (Non-African Amer.): 43 — ABNORMAL LOW
GFR CALC AF AMER: 50 — AB
GLUCOSE: 85 mg/dL (ref 65–99)
Osmolality: 281 (ref 275–301)
Potassium: 3.5 mmol/L (ref 3.5–5.1)
Sodium: 139 mmol/L (ref 136–145)

## 2013-10-06 LAB — BASIC METABOLIC PANEL
Anion Gap: 4 — ABNORMAL LOW (ref 7–16)
BUN: 21 mg/dL — ABNORMAL HIGH (ref 7–18)
CALCIUM: 9 mg/dL (ref 8.5–10.1)
CHLORIDE: 97 mmol/L — AB (ref 98–107)
CREATININE: 1.53 mg/dL — AB (ref 0.60–1.30)
Co2: 39 mmol/L — ABNORMAL HIGH (ref 21–32)
EGFR (African American): 57 — ABNORMAL LOW
EGFR (Non-African Amer.): 49 — ABNORMAL LOW
Glucose: 96 mg/dL (ref 65–99)
OSMOLALITY: 282 (ref 275–301)
Potassium: 3.2 mmol/L — ABNORMAL LOW (ref 3.5–5.1)
SODIUM: 140 mmol/L (ref 136–145)

## 2013-10-07 LAB — BASIC METABOLIC PANEL
Anion Gap: 3 — ABNORMAL LOW (ref 7–16)
BUN: 20 mg/dL — ABNORMAL HIGH (ref 7–18)
CHLORIDE: 92 mmol/L — AB (ref 98–107)
CREATININE: 1.44 mg/dL — AB (ref 0.60–1.30)
Calcium, Total: 9.1 mg/dL (ref 8.5–10.1)
Co2: 43 mmol/L (ref 21–32)
EGFR (African American): >60
EGFR (Non-African Amer.): 53 — ABNORMAL LOW
GLUCOSE: 169 mg/dL — AB (ref 65–99)
Osmolality: 282 (ref 275–301)
POTASSIUM: 3.6 mmol/L (ref 3.5–5.1)
Sodium: 138 mmol/L (ref 136–145)

## 2013-10-07 LAB — MAGNESIUM: Magnesium: 1.2 mg/dL — ABNORMAL LOW

## 2013-10-07 LAB — CULTURE, BLOOD (SINGLE)

## 2013-10-08 LAB — MAGNESIUM: MAGNESIUM: 1.7 mg/dL — AB

## 2013-10-08 LAB — CREATININE, SERUM: Creatinine: 1.24 mg/dL (ref 0.60–1.30)

## 2013-10-08 LAB — POTASSIUM: Potassium: 3.3 mmol/L — ABNORMAL LOW (ref 3.5–5.1)

## 2014-07-23 DEATH — deceased

## 2014-09-10 NOTE — Discharge Summary (Signed)
PATIENT NAME:  Brent [name], Brent [name] MR#:  [id number] DATE OF BIRTH:  27-Jun-1955  DATE OF ADMISSION:  12/23/2011 DATE OF DISCHARGE:    REASON FOR ADMISSION: Weakness, unable to walk, swollen testicles, and difficulty voiding.    FINAL DISCHARGE DIAGNOSIS: Epididymoorchitis.  OTHER ASSOCIATED DIAGNOSES WITH THIS ADMISSION:  1. Acute prostatitis with scrotal cellulitis.  2. Urinary obstruction requiring Foley catheter.  3. Atrial fibrillation.  4. RVR treated with IV metoprolol and resolved.  5. Acute renal failure.  6. Chronic kidney disease, stage III. 7. SIRS.  8. Mild rhabdomyolysis, now improved.  9. Insulin dependent diabetes, uncontrolled.  10. Morbid obesity.  11. Hypertension.  12. Generalized weakness.   DISPOSITION: Home.   CONDITION AT DISCHARGE: Good.   FOLLOW-UP:  1. Follow-up with Dr. Leavy Cella of ID in 2 to 3 weeks.  2. Follow-up with Dr. Sherrie Mustache, the patient's primary care physician, in 2 to 3 weeks.   MEDICATIONS AT DISCHARGE: 1. Levofloxacin 750 mg 1 p.o. daily for 2 to 3 weeks.  2. Flomax 0.4 mg once daily at bedtime.  3. Cardizem 240 mg extended-release once a day.  4. Coumadin 7.5 mg once daily.  5. Glyburide 5 mg tablet 2 times daily.  6. Metoprolol 100 mg twice daily.  7. Potassium chloride 20 mEq twice daily.  8. Allopurinol 100 mg oral tablet.  9. Colchicine 0.6 mg once daily.  10. Benazepril with hydrochlorothiazide 20/12.5 mg tablet once daily.  11. NovoLog sliding scale. 12. Lantus 65 units once a day.   HOSPITAL COURSE: Brent [name] is a 59 year old gentleman who has morbid obesity, diabetes, atrial fibrillation, and hypertension. He has history of chronic kidney disease and he presented to the ER brought by the EMS feeling extremely weak with difficulty urinating, noticed to have some significant swelling of his testicles with redness along the scrotal area and groin area. He was admitted for treatment of prostatitis, epididymitis/epididymoorchitis.  The patient received a Foley catheter on admission due to significant urinary obstruction due to swelling around the testicular area obstructing the outflow of the urethra. The patient was put on antibiotics starting with amoxicillin as an outpatient. When he showed up to the ER he had septic-like syndrome/SIRS. He was put on IV fluids due to slight hypotension with blood pressures in the low hundreds over 60's. The patient was symptomatic with heart rates in the 140's and acute kidney injury. The patient was treated with Rocephin. After several days of no improvement with continuation of symptoms, erythema, and pain as well as increased white blood cells, the medications were changed to Zosyn. The patient was also started on vancomycin due to the significant edema and possible Fournier's gangrene, although improvement was significant after the patient's antibiotics were changed to Zosyn; the vancomycin was stopped. No signs of Fournier. We were trying to get some more imaging but the only one that we were able to get was an ultrasound of the scrotal area which showed no evidence of torsion with his mild varicocele. No findings related to abscesses. The patient was too big to fit into CT scan or to get a transrectal ultrasound. His Vancomycin was stopped. ID was consulted. They decided to change antibiotic management to Levaquin to cover for Pseudomonas, gram-negative, and some atypicals. GC/Chlamydia test was taken prior to discharge. Dr. Leavy Cella is going to follow-up with the patient as an outpatient. He is going to go on Levaquin for 2 to 3 weeks and follow-up white blood count. The white blood count remains elevated  around the 1900's. When the patient came he had a small increase in CK. He was treated with IV fluids, likely is rhabdomyolysis result. His cultures have been negative so far. The patient is asymptomatic and his pain has improved significantly. The patient has decrease of erythema and edema mostly  resolved by discharge.   The patient also developed RVR during this hospitalization and he was able to get resolved with a dose of metoprolol. His blood pressure has been stable. He is able to take his blood pressure prescription now. His diabetes had been uncontrolled but had been uncontrolled prior to coming over here. He states that lately his blood sugars have been running a little bit higher. Also, he had increase of blood sugars due to the stress and the septic syndrome. At this moment he is going back home with his previous dose of insulin.   ____________________________ Felipa Furnaceoberto Sanchez Gutierrez, MD rsg:drc D: 12/30/2011 09:56:08 ET T: 12/30/2011 11:34:22 ET JOB#: [id number]  cc: Felipa Furnaceoberto Sanchez Gutierrez, MD, [name] Rosalyn GessMichael E. Blocker, MD Demetrios Isaacsonald E. Sherrie MustacheFisher, MD Pearletha FurlOBERTO SANCHEZ GUTIERRE MD ELECTRONICALLY SIGNED 01/08/2012 13:19

## 2014-09-10 NOTE — Consult Note (Signed)
Impression: 59yo WM w/ h/o morbid obesity, DM, CRI admitted with epididymitis and orchitis.  GC and Chlamydia are the most common etiologies for epididymo-orchitis in men <35.  In older men, Enterics and Pseudomonas are more common.  Atypical organisms such as Ureaplasma and Mycoplasma are also possible. Will send PCR for GC and Chlamydia. Change antibiotics to levofloxacin.  This will cover Enterics, Pseudomonas and the atypical organisms.  It may not cover GC/Chlamydia. Follow WBC. If his WBC does not come down, will consider a transrectal prostatic u/s to look for abscess. 6)  Levofloxacin may interact with the coumadin.  Follow INR.  Electronic Signatures: Arla Boutwell, Rosalyn GessMichael E (MD) (Signed on 06-Aug-13 15:52)  Authored   Last Updated: 06-Aug-13 15:55 by Selassie Spatafore, Rosalyn GessMichael E (MD)

## 2014-09-10 NOTE — H&P (Signed)
PATIENT NAME:  Brent [name], Brent [name] MR#:  [id number] DATE OF BIRTH:  [birth year]  DATE OF ADMISSION:  12/23/2011  PRIMARY CARE PHYSICIAN: Dr. Mila Merryonald [name] ER PHYSICIAN: Dr. Malachy Moanevainder [name]  CHIEF COMPLAINT: Weakness, unable to walk, swollen testicles and difficulty voiding.    HISTORY OF PRESENT ILLNESS: 59 year old morbidly obese patient with history of atrial fibrillation, diabetes, hypertension, below-knee amputation, chronic kidney disease stage III presents to the Emergency Room brought in by EMS with complaints of feeling extremely weak, unable to walk and difficulty vomiting. Patient has also noticed some blood in his urine although not frank. Patient has had swelling in his testicles along with redness and pain. Presently has a Foley catheter inserted. Has not had any swelling in his legs. Occasional exertional shortness of breath which is chronic. No chest pain. Does have headache. Feels constipated. Patient was started on amoxicillin yesterday by Dr. Sherrie [name] after the patient complained of some cough. He has been taking all his medications.   PAST MEDICAL HISTORY:  1. Diabetes. 2. Hypertension. 3. Chronic kidney disease stage III. 4. Nephrolithiasis. 5. Gout. 6. Gastroesophageal reflux disease.  7. Morbid obesity. 8. Osteoarthritis. 9. Atrial fibrillation diagnosed in 2007.   PAST SURGICAL HISTORY: None.   ALLERGIES: No known drug allergies.   FAMILY HISTORY: Father was an alcoholic. No family history of diabetes.   SOCIAL HISTORY: Does not smoke, drink alcohol or use illicit drugs. [place] locally in [place]. He is on disability. Lives at home with his wife.   CODE STATUS: FULL CODE.   HOME MEDICATIONS:  1. Coumadin 7.5 mg oral once a day.  2. Metoprolol 100 mg, 1.5 tablets twice a day.  3. Cardizem CD 240 mg oral once a day.  4. Colchicine 0.6 mg, 2 orally as needed for gout.  5. Glyburide 5 mg, 1 tablet orally 2 times a day.  6. Lantus 65 units subcutaneous once a day.   7. NovoLog sliding scale.  8. Potassium chloride 20 mEq oral once a day.  9. Allopurinol 100 mg oral once a day.  10. Amlodipine 5 mg oral once a day.  11. Amoxicillin 500 mg oral 2 capsules orally 2 times a day for 10 days, started on 12/22/2011.  12. Benazepril/hydrochlorothiazide 12.5/20, 1 tablet oral once a day.    REVIEW OF SYSTEMS: CONSTITUTIONAL: Patient complains of fatigue, weakness, and weight gain. EYES: No blurred vision, double vision, pain or redness. ENT: No tinnitus, ear pain. Does complain of some sinus congestion and postnasal drip. RESPIRATORY: Does have cough. No wheezing. Has clear productive sputum, which is chronic. CARDIOVASCULAR: No chest pain. Has chronic orthopnea. No edema. GASTROINTESTINAL: Has constipation. No nausea, vomiting or abdominal pain. GENITOURINARY: Does have dysuria, hesitancy and some hematuria. ENDOCRINE: No polyuria or thyroid problems. HEMATOLOGY/LYMPHATIC: No anemia or easy bruising. SKIN: No ulcers or rash. MUSCULOSKELETAL: Has no joint swelling, redness, effusion. NEUROLOGICAL: Does complain of generalized weakness. No focal neurological deficits or dementia. PSYCHIATRIC: No anxiety, depression.   PHYSICAL EXAMINATION:  VITAL SIGNS: Temperature 97, pulse ranging between 120 to 156, respirations 20, blood pressure 107/60, pulse oximetry 97% on room air.   GENERAL: Moderately obese Caucasian male patient lying in bed in no distress, cooperative with exam.   PSYCHIATRIC: Alert and oriented x3. Mood and affect appropriate. Poor judgment.   HEENT: Atraumatic, normocephalic. Oral mucosa moist and pink. External ears and nose normal. No pallor. No icterus. Pupils bilaterally equal and reactive to light.    NECK: Short neck. Supple. No thyromegaly. No palpable  lymph nodes. Trachea midline. No carotid bruit or JVD.   CARDIOVASCULAR: S1, S2, tachycardic, irregular. No murmurs. Peripheral pulses 2+. No edema or JVD.   RESPIRATORY: Normal work of  breathing. Clear to auscultation on both sides.   GASTROINTESTINAL: Soft abdomen, nontender. Bowel sounds present. No hepatosplenomegaly palpable. No hernias.   GENITOURINARY: Has erythematous testicles with tenderness. No swelling or fluctuance noticed. Prostate exam deferred secondary to prostatitis.   SKIN: Warm and dry. No petechiae, rash, ulcers.   MUSCULOSKELETAL: No joint swelling, redness, effusion of the large joints. Normal muscle tone.   NEUROLOGICAL: Motor strength 5/5 in upper and lower extremities. Sensation to fine touch intact all over.   LABORATORY, DIAGNOSTIC AND RADIOLOGICAL DATA: Glucose 176, BNP 1451, BUN 35, creatinine 1.56, GFR 49, sodium 136, potassium 4.1, magnesium 1.5, albumin 2.5, WBC 19.5, hemoglobin 14.5, platelets 518, 1% bands and segmented neutrophils 76%. INR 1.9. Urinalysis is hazy with no WBC and no bacteria.   Chest x-ray shows no acute abnormalities. Does have pulmonary nodules which needs follow-up CT scan as outpatient.   EKG shows atrial fibrillation.   ASSESSMENT AND PLAN:  1. Sepsis secondary to possible acute prostatitis with epididymitis. Will get ultrasound of the testicles as he has erythema and pain. Will start patient on ceftriaxone and vancomycin at this time to cover for any skin infections and prostatitis. Will await culture results. Patient will also be on IV fluids. White count of 19.5, heart rate ranging in the 140s. Has some mild acute renal failure secondary to sepsis with end organ damage.  2. Acute renal failure/chronic kidney disease secondary to sepsis. Will start him on IV fluids and closely monitor for any fluid overload.  3. Atrial fibrillation with rapid ventricular rate, likely secondary from the sepsis. Continue his metoprolol, Cardizem. Will give a STAT dose of Cardizem as he is getting hypotensive with low normal blood pressures at 100 and also tachycardic in the 150s. Unable to control heart rate in spite of giving two doses  of metoprolol. Needs close monitoring. Will be on a cardiac tele floor. On anticoagulation with Coumadin. INR 1.9. Will recheck in the morning. Continue Coumadin.  4. Diabetes mellitus. Will be on sliding scale insulin and oral hypoglycemic medications.  5. Hypertension. Presently patient is in low normal blood pressure. Will hold his Norvasc and also hold the hydrochlorothiazide and the ACE inhibitor secondary to some mild acute renal failure. Continue metoprolol, Cardizem. Monitor blood pressure closely.  6. Deep vein thrombosis prophylaxis. Patient is on Coumadin.  TIME SPENT: Time spent today on this case was more than 75 minutes with greater than 50% time spent in coordination of care.  ____________________________ Molinda Bailiff. Laurajean Hosek, MD srs:cms D: 12/23/2011 11:55:14 ET T: 12/23/2011 12:17:32 ET JOB#: [id number]  cc: Wardell Heath R. Jessamyn Watterson, MD, [name] Demetrios Isaacs. Brent Mustache, MD  Orie Fisherman MD ELECTRONICALLY SIGNED 01/04/2012 0:21

## 2014-09-10 NOTE — Consult Note (Signed)
PATIENT NAME:  Brent [name], Brent [name] MR#:  [id number] DATE OF BIRTH:  11/21/1955  DATE OF CONSULTATION:  12/28/2011  REFERRING PHYSICIAN:  Dr. Berlinda Last  CONSULTING PHYSICIAN:  Rosalyn Gess. Danyael Alipio, MD  REASON FOR CONSULTATION: Leukocytosis.   HISTORY OF PRESENT ILLNESS: The patient is a 59 year old white man with a past medical history significant for morbid obesity, chronic renal insufficiency, and diabetes who was admitted on 08/01 with approximately one week history of generalized malaise, cough, shortness of breath, and painful swollen testicles. The patient states that he had been having some generalized malaise over several days prior to his admission and on the day of admission he sat up on the edge of his bed and felt significantly more poorly with more shortness of breath. He called 9-1-1 and was admitted to the hospital. The day before admission he was apparently started on amoxicillin. On admission he was found to have an unremarkable chest x-ray. His testicle swollen and he underwent an ultrasound which showed no evidence for torsion. He has been treated with vancomycin and ceftriaxone initially. The ceftriaxone was changed to Zosyn on 08/04. His white count, which was 19.5 on admission, has not significantly decreased during the course of his hospitalization. The patient is mostly bedbound but does get up and use a walker occasionally. He states he has been having some fevers, chills, sweats, and malaise over the last week before admission. His breathing and his fevers have improved over the last day or so. He denies any change in his bowels. He has not had any significant change in his urine.   ALLERGIES: None.   PAST MEDICAL HISTORY:  1. Diabetes.  2. Chronic renal insufficiency.  3. Nephrolithiasis.  4. Hypertension.  5. Gout.  6. Morbid obesity.  7. Gastroesophageal reflux disease.  8. Osteoarthritis.  9. Atrial fibrillation, stable.   FAMILY HISTORY: Positive for diabetes  and alcoholism.   SOCIAL HISTORY: The patient lives with his wife and his daughter. He does not smoke. He does not drink alcohol. He has no history of injecting drug use.   REVIEW OF SYSTEMS: GENERAL: Positive fevers, chills, sweats, and malaise. HEENT: No headaches. No sinus congestion. No sore throat. NECK: No stiffness. No swollen glands. RESPIRATORY: Positive cough and shortness of breath. All of these have improved. No wheezing. CARDIAC: No chest pains or palpitations. No peripheral edema. GI: No nausea, no vomiting, no significant abdominal pain, no change in his bowels. He has chronic constipation. GU: He has painful swollen testicles. He could not distinguish right greater than left. He has had no change in his urination with no dysuria, no increased frequency, no foul smelling urine. MUSCULOSKELETAL: No focal joint swelling or redness. SKIN: No rashes. NEUROLOGIC: No focal weakness although he does have general weakness. PSYCHIATRIC: No complaints. All other systems are negative.   PHYSICAL EXAMINATION:   VITAL SIGNS: T-max 98.9, T-current 97.5, pulse 84, blood pressure 148/83, 93% on 2 liters.   GENERAL: 59 year old obese white man in no acute distress.   HEENT: Normocephalic, atraumatic. Pupils equal and reactive to light. Extraocular motion intact. Sclerae, conjunctivae, and lids without evidence for emboli or petechiae. Oropharynx shows no erythema or exudate. Teeth and gums are in fair condition.   NECK: Supple. Full range of motion. Midline trachea. No lymphadenopathy. No thyromegaly.   CHEST: Clear to auscultation bilaterally with good air movement. No focal consolidation.   CARDIAC: Regular rate and rhythm without murmur, rub, or gallop.   ABDOMEN: Soft, obese, nontender, nondistended. No  hepatosplenomegaly appreciated. No hernia is noted.   SCROTAL EXAM: The patient had no erythema in the scrotal sac. Normal non-circumcised penis. There was no swelling of the penis. The left  testicle was nontender, slightly small in size. The right testicle was moderately tender to touch but there was no surrounding erythema.  RECTAL: Rectal exam was deferred as it was done earlier today. Per his report the exam was not comfortable but palpation of his prostate did not cause undue discomfort.   EXTREMITIES: No evidence for tenosynovitis.   SKIN: No rashes. No stigmata of endocarditis, specifically no Janeway lesions or Osler nodes.   NEUROLOGIC: The patient was awake and interactive, moving all four extremities.   PSYCHIATRIC: Mood and affect appeared normal.   LABORATORY DATA: BUN 42, creatinine 1.53, sodium 129, bicarbonate 24, anion gap 10. LFTs from 08/02 showed AST 38, ALT 25, alkaline phosphatase 91, total bilirubin 0.4. White count today is 19.7 with hemoglobin 12.6, platelet count 552, ANC of 15.9. White count on admission was 19.5. Blood cultures from admission are negative. Urinalysis was unremarkable. Urine culture shows no growth.  Chest x-ray from admission showed a possible pulmonary nodule in the left upper lobe and cardiomegaly but no CHF.   Testicular ultrasound showed no evidence for torsion and a small varicocele on the left.    Repeat chest x-ray on 08/04 showed no acute cardiopulmonary disease.   IMPRESSION: This is a 59 year old white man with a history of morbid obesity, diabetes, and chronic renal insufficiency admitted with epididymitis and orchitis.   RECOMMENDATIONS:  1. GC and Chlamydia are the most common etiologies for epididymoorchitis in men less than 60 years old. In older men, enterics and Pseudomonas are more common. Atypical organisms such as Ureaplasma and mycoplasma are also possible.  2. Will send a PCR for GC and Chlamydia. He's already received ceftriaxone which will treat the GC.  3. Will change his antibiotics to levofloxacin. This will cover enterics, Pseudomonas, and the atypical organisms. It will also penetrate into the prostate  much better. It may not cover GC and chlamydia.  4. Will follow his white count.  5. If his white count does not come down, will consider a transrectal prostatic ultrasound to look for an abscess.  6. Levofloxacin may interact with the Coumadin. Would follow the INR.   This is a moderately complex Infectious Disease case. Thank you very much for involving me in Brent [name]'s care.   ____________________________ Rosalyn GessMichael E. Carlethia Mesquita, MD meb:drc D: 12/28/2011 16:04:08 ET T: 12/29/2011 16:10:9608:26:28 ET JOB#: [id number]  cc: Rosalyn GessMichael E. Debara Kamphuis, MD, [name] Yacine Garriga E Alexsa Flaum MD ELECTRONICALLY SIGNED 01/14/2012 15:11

## 2014-09-13 NOTE — Consult Note (Signed)
PATIENT NAME:  Brent [name], Brent [name] MR#:  [id number] DATE OF BIRTH:  Apr 01, 1956  DATE OF CONSULTATION:  01/02/2013  REFERRING PHYSICIAN:  Dr. Sherryll Burger  CONSULTING PHYSICIAN:  Stann Mainland. Sampson Goon, MD  REASON FOR CONSULTATION:  Necrotic right lower extremity wound.   HISTORY OF PRESENT ILLNESS:  This is a morbidly obese 59 year old gentleman with poorly controlled diabetes, who was admitted August 4th after a several week history of a worsening wound on his right lower extremity and weakness. He was found on admission to have an elevated white blood count and a large necrotic wound. He underwent surgical debridement on August 5th by Dr. Michela Pitcher with followup on August 8th and change of a wound VAC. He also has chronic kidney disease and has been followed by renal for this. His creatinine has been worsening, and there is concern for ATN and antibiotic toxicities with this.   PAST MEDICAL HISTORY:  1.  Morbid obesity.  2.  Diabetes, poorly controlled.  3.  Chronic kidney disease stage 3.  4.  Nephrolithiasis.  5.  GERD.  6.  Gout.  7.  Atrial fibrillation.   PAST SURGICAL HISTORY:  None except for surgical debridement as above.   ALLERGIES:  No known drug allergies.   FAMILY HISTORY:  Positive for alcoholism in his father and a strong family history of diabetes.   SOCIAL HISTORY:  The patient is mainly bedbound. He has family involved in his care. He does not smoke or drink. He is on disability.   MEDICATIONS:  Antibiotic-wise, he was started initially on piperacillin on August 3rd. This was adjusted as his renal function worsened and then stopped on August 11th. His vancomycin was also started August 3rd and adjusted as his renal function worsened. His max vancomycin trough was greater than 30. He currently is on doxycycline 100 mg twice a day. Other medications include: diltiazem, metoprolol, Coumadin, sliding scale insulin, vitamin D2, magnesium, and Flexeril.   REVIEW OF SYSTEMS:  Eleven systems  reviewed and negative except as per HPI.   PHYSICAL EXAMINATION:  VITAL SIGNS:  T-max 98.2 in the last 24 hours. Blood pressure is 159/83, pulse 85, respirations 18, sat is 91% on 2 L.  GENERAL:  He is morbidly obese, lying in bed. Slightly slowed speech.  HEENT:  Pupils equal, round, and reactive to light and accommodation. Extraocular movements are intact. Sclerae anicteric. Oropharynx clear.  NECK:  Supple. He has in his right neck an IJ catheter, which is clean, dry, and intact.  HEART:  Distant heart sounds.  LUNGS:  Clear.  ABDOMEN:  Obese, soft, nontender, nondistended.  EXTREMITIES:  He has 2+ edema, bilateral lower extremities. His right lower extremity has a large wound that is covered tightly with a wound VAC. There is minimal tenderness to palpation. There is some surrounding erythema noted through the wound VAC dressing.  NEUROLOGIC:  He is alert and oriented x3. Grossly nonfocal neuro exam.   DATA:  The patient's renal function has worsened from admission at 1.46 up to 2.78 and then up to max of 5.0. It is now down to 4.93. His A1c is greater than 11. His albumin is quite low at 1.5. Blood cultures x2 on admission were negative. No other cultures from the wounds were done. On admission, August 7th, his white count was 18.9. This has not been repeated. Hemoglobin was 13.0, platelets of 500. Chest x-ray done August 5th showed some pulmonary edema and a catheter in place. Renal ultrasound revealed normal renal ultrasound  done on August 7th.   IMPRESSION:  A 59 year old morbidly obese gentleman with poorly controlled diabetes with a large necrotic wound in his right leg now status post debridement and wound VAC placement x2. Apparently, the wound is progressing nicely with the wound VAC. He was treated initially with vancomycin and Zosyn, but now has worsening renal function, as well as urine eosinophilia suggestive of an acute tubular necrosis versus an acute interstitial nephritis picture.  He is being followed closely by Renal. We are consulted for further antibiotic management.   At this time, he has no evidence of sepsis. He is okay to be off of IV antibiotics I believe. This wound will take a long time to heal and could likely be covered with oral antibiotics until it is improving. We have no culture data except blood cultures, which were negative, but the usual pathogens in a wound like this would be mixed, including staphylococci, streptococci, anaerobes, and possibly Gram-negatives. Pseudomonas is always a possibility as well in patients with poorly controlled diabetes and lower extremity wounds. We have not isolated methicillin-resistant Staphylococcus aureus. However, that is also a possibility.   At this point, since we want to avoid beta-lactams given the urine eosinophilia and possibility of acute interstitial nephritis, I would recommend we continue doxycycline, which will provide good staphylococcus and streptococcus coverage. It will also provide decent Gram-negative coverage; however, will not provide much in the way of anaerobic coverage. I would recommend we add Flagyl 500 mg twice a day for improved anaerobic coverage.   If the wound worsens, we could consider adding ciprofloxacin as well for better Gram-negative coverage and pseudomonal coverage.   Suggest repeating a CBC at this time and also checking a sedimentation rate.   Thank you for the consult. I will be glad to follow with you.     ____________________________ Stann Mainlandavid P. Sampson GoonFitzgerald, MD dpf:ms D: 01/02/2013 17:37:04 ET T: 01/02/2013 18:42:55 ET JOB#: [id number]  cc: Stann Mainlandavid P. Sampson GoonFitzgerald, MD, [name] Dalal Livengood Sampson GoonFITZGERALD MD ELECTRONICALLY SIGNED 01/02/2013 20:58

## 2014-09-13 NOTE — Consult Note (Signed)
PATIENT NAME:  Brent [name], Brent [name] MR#:  [id number] DATE OF BIRTH:  07-Nov-1955  DATE OF CONSULTATION:  12/25/2012  CONSULTING PHYSICIAN:  Cristal Deer A. Maguire Sime, MD  CONSULTING PHYSICIAN: Si Raider. Velecia Ovitt, M.D.   REASON FOR CONSULTATION: Right lower extremity wound.   HISTORY OF PRESENT ILLNESS: Brent [name] is a pleasant 59 year old male with history of atrial fibrillation on Coumadin, gout, hypertension, diabetes, chronic kidney disease stage III, nephrolithiasis, gastroesophageal reflux. He has morbid obesity and osteoarthritis, who presents with approximately three weeks of growing, worsening right lower extremity wound. He said it began small over the last few weeks has become large and "burst." He noticed that there areas of black and has been red over that time too. He said that he had a plan to see his primary care doctor,  Brent [name] today; however, when he was walking to the car with his family, he had difficulty walking and; therefore, they brought to the Emergency Room. Otherwise has seen a Brent [name] in the past for leg wounds. No fevers, chills, night sweats, shortness of breath, chest pain, abdominal pain, nausea, vomiting, diarrhea, constipation, dysuria or hematuria.   PAST MEDICAL HISTORY: As follows:  1.  Diabetes.  2.  Hypertension.  3.  Chronic kidney disease stage III.  4.  Left nephrolithiasis.  5.  Gout.  6.  Gastroesophageal reflux disease.  7.  Obesity.  8.  Osteoarthritis.  9.  Atrial fibrillation.   ALLERGIES: No known drug allergies.    MEDICATIONS:  Potassium chloride 20 mEq 1 tab p.o. daily, Novolin N 330 units b.i.d.,  metoprolol 50 b.i.d., Lasix 20 mg p.o. daily, glyburide 5 mg p.o. b.i.d., Coumadin 7.5 mg p.o. daily, Cardizem 240 mg one cap p.o. daily, allopurinol 100 mg p.o. daily.   SOCIAL HISTORY: Denies alcohol, tobacco or other drugs. Lives in [place] at home with his wife.   FAMILY HISTORY: No family history of diabetes.   REVIEW OF  SYSTEMS:  A 12-point review of systems review of systems obtained. Pertinent positives and negatives as above.   PHYSICAL EXAMINATION: VITAL SIGNS: Temperature 98.4, pulse 90, blood pressure 161/82, respirations 17, 95% on room air.  GENERAL: No acute distress. Alert and oriented x 3.  HEAD: Normocephalic, atraumatic.  EYES: No scleral icterus. No conjunctivitis.  FACE: No obvious facial trauma. Normal external nose. Normal external ears .  CHEST: Lungs clear to auscultation. Moving air well.  HEART: Regular rate and rhythm. No murmurs, rubs, or gallops.  ABDOMEN: Soft, obese, nontender, nondistended.  EXTREMITIES: Moves all extremities well. Strength 5/5. He does have an approximately 8 x 5 cm eschar and posterior leg with some fibrinous exudate. There is some cellulitis and induration of his leg. No obvious fluctuance. No obvious bulla.  NEUROLOGIC: Cranial nerves II through XII grossly intact. He has a tender right lower extremity. Sensation intact to left lower extremity.   LABORATORY, DIAGNOSTIC AND RADIOLOGIC DATA: White cell count 18.6, hemoglobin 13.5, hematocrit 42.1 and platelets 585. INR is 1.8, creatinine is 1.41. BNP is 5083, glucose 264 currently 219. LFTs are normal. Albumin is 2.1 however.   Imaging of left lower extremity, which shows no deep venous thrombosis.   ASSESSMENT AND PLAN: Brent [name] is a pleasant 59 year old male with three weeks of worsening leg pain and eschar. I agree that his leg is in need of debridement and he is in need of IV antibiotics upon debridement in order to examine wound further. No need for emergent debridement, we will diet tonight and  make nothing oral after midnight and consent for debridement of lower extremity in the morning. I discussed this patient and he agrees with the plan.   ____________________________ Si Raiderhristopher A. Jesicca Dipierro, MD cal:cc D: 12/25/2012 20:27:07 ET T: 12/25/2012 21:17:47 ET JOB#: [id number]  cc: Cristal Deerhristopher A.  Kemba Hoppes, MD, [name] Jarvis NewcomerHRISTOPHER A Yvonne Petite MD ELECTRONICALLY SIGNED 12/28/2012 20:32

## 2014-09-13 NOTE — H&P (Signed)
PATIENT NAME:  Brent [name], Brent [name] MR#:  [id number] DATE OF BIRTH:  [birth year]  DATE OF ADMISSION:  12/25/2012  PRIMARY CARE PHYSICIAN: Mila Merry, MD.   REFERRING PHYSICIAN: Dorothea Glassman, MD.   CHIEF COMPLAINT: Right lower extremity drainage.   HISTORY OF PRESENT ILLNESS: The patient is a morbidly obese 59 year old Caucasian male with history of hypertension; CKD, stage III; bedbound state; and Afib. The patient stated that he came into the hospital after experiencing right lower extremity drainage. He thinks that he might have scraped his foot along the bed and started to have redness, pain and tenderness about three weeks ago, and it progressed.   About a week or so, he started to have the tissue on the interior side of the leg turning black. It has been draining. That is why he came into the hospital. He was going to see Dr. Sherrie Mustache but felt weak.   He has had no fevers or chills. He is noted to have significant leukocytosis here with WBC of 18.   Of note, the patient also underwent a right lower extremity Doppler which is negative for a DVT. Hospitalist services were contacted for further evaluation and management.   PAST MEDICAL HISTORY: Diabetes; hypertension; CKD, stage III; nephrolithiasis; gout;  GERD; morbid obesity; in bedbound state most of the time; history of Afib.   PAST SURGICAL HISTORY: Denies.   ALLERGIES: No known drug allergies.   FAMILY HISTORY: Father was alcoholic. Also, there is a family history of diabetes.   SOCIAL HISTORY: He does not smoke. Does not use alcohol or illicit drugs. He is on disability. He is mostly bed-bound, has occasional walking with a walker.   OUTPATIENT MEDICATIONS: Allopurinol 100 mg daily, Cardizem CD 240 mg extended-release one cap once a day, Coumadin 7.5 mg daily, glyburide 5 mg one tab 2 times a day, Lasix 20 mg daily, metoprolol tartrate 150 mg two times a day, Novolin N 30 units two times a day, potassium chloride 20 mEq daily.    REVIEW OF SYSTEMS:  CONSTITUTIONAL: No fever, fatigue, weakness or weight changes.  EYES: No blurry vision or double vision.  ENT: No tinnitus or hearing issues. He does have some chronic congestion in the sinuses.  RESPIRATORY: Chronic cough with whitish sputum production. Has occasional shortness of breath.  CARDIOVASCULAR: No chest pain. Has chronic lower extremity edema and orthopnea. No history of CHF.  GENITOURINARY: Denies dysuria. Has occasional hematuria and had some today.  ENDOCRINE: No polyuria, nocturia.  HEMATOLOGIC AND LYMPHATIC: No anemia or easy bruising.  SKIN: Positive for rash in the right lower extremity with drainage as described above.  MUSCULOSKELETAL: Chronic arthritis. NEUROLOGIC: No focal weakness or numbness. Has no stroke or TIA.  PSYCHIATRIC: Has no anxiety or depression.   PHYSICAL EXAMINATION:  VITAL SIGNS: Temperature on arrival 98.3, pulse rate 95, last pulse rate was 100. Respiratory rate 18 on arrival. Initial blood pressure 181/109, oxygen saturation 94% on room air.  GENERAL: Morbidly obese Caucasian male lying in bed in no obvious distress, talking in full sentences.  HEENT: Normocephalic, atraumatic. Pupils are equal and reactive. Anicteric sclerae. Moist mucous membranes.  NECK: Supple. No thyroid tenderness. No cervical lymphadenopathy.  CARDIOVASCULAR: S1, S2. Tachycardic. Irregularly irregular. No significant murmurs appreciated.  LUNGS: Clear to auscultation without wheezing, rhonchi or rales.  ABDOMEN: Soft, nontender, and nondistended. Positive bowel sounds all quadrants. Unable to appreciate any organomegaly.  EXTREMITIES: No significant lower extremity edema.  SKIN: A 9 x 9 __________  area of  medial inner lower extremity essential wound with eschar formation and gangrenous appearing. This area is black and has surrounding areas of redness. The redness and cellulitis tracks to right below the knee. There is some whitish drainage at the base  of the gangrenous area.  NEUROLOGIC: Cranial nerves II through XII grossly intact. Strength is five out of five in all extremities. Sensation is intact to light touch.  PSYCHIATRIC: Awake, alert, oriented x3.   DIAGNOSTIC STUDIES: Glucose 264. BNP is 5083. BUN is 21. Creatinine 1.41, sodium 137, potassium is 4. Albumin is 2.1. AST 12; otherwise, LFTs are within normal limits. Troponins negative x1. WBC 18.6, hemoglobin 13.5. Platelets are 585. MCV of 77. INR 1.8. UA has no nitrites or leukocyte esterase, 6 RBC, 10 WBC, 1+ blood.   ABG done shows pH of 7.44, pCO2 of 48, pO2 of 56.   X-ray chest, portable, one view, shows possible perihilar infiltrate versus artifact on the right; borderline to mild cardiomegaly. Lower extremity Doppler on the right showed no DVT.  EKG: Afib rate is 105. Low QRS. Some nonspecific T-wave changes but no acute ST elevations or depressions.   ASSESSMENT AND PLAN: We have a morbidly obese, almost bedbound, Caucasian male with history of chronic kidney disease, hypertension, atrial fibrillation on Coumadin, who presents with sepsis with leukocytosis and tachycardia with evidence for cellulitis in addition to possible gangrenous wound requiring debridement likely.   Would admit the patient to the hospital. Would start him on vancomycin and Zosyn. Would follow with the blood cultures and obtain a surgical consult. Would make him n.p.o. in case he is going to be taken to the OR and start him on Tylenol p.r.n. for fever, although he does not have a temperature now.   We would also start him on some morphine p.r.n. for pain. Would continue his diltiazem and metoprolol for rate control for his atrial fibrillation. It is pretty much controlled and he is on Coumadin already but I will hold the Coumadin in case he will be going to the operating room. I would wait for further surgical input and then deal with the INR and Coumadin issues at that time.   The patient would likely also  need wound care nurses after discharge and possible also  requires a wound VAC. As he is going to be n.p.o. for now, I would cut his insulin by half, start him on sliding-scale insulin, check a hemoglobin A1c.   He has stable chronic kidney disease, stage III. He has a mildly elevated BNP, but I doubt he has acute congestive heart failure. He has chronic shortness of breath but has clear lungs and no significant pleural effusions. I would start him on heparin for deep vein thrombosis prophylaxis.   CODE STATUS: FULL CODE.   TOTAL TIME SPENT: 60 minutes.    ____________________________ Krystal EatonShayiq Iyania Denne, MD sa:np D: 12/25/2012 18:26:00 ET T: 12/25/2012 20:36:21 ET JOB#: [id number]  cc: Krystal EatonShayiq Branton Einstein, MD, [name] Demetrios Isaacsonald E. Sherrie MustacheFisher, MD  Krystal EatonSHAYIQ Jarmar Rousseau MD ELECTRONICALLY SIGNED 01/29/2013 12:04

## 2014-09-13 NOTE — Op Note (Signed)
PATIENT NAME:  Brent [name], Brent [name] MR#:  [id number] DATE OF BIRTH:  [birth year]  DATE OF PROCEDURE:  12/26/2012  PREOPERATIVE DIAGNOSES:  Necrotic ulcer, right lower extremity. Lack of intravenous access.   POSTOPERATIVE DIAGNOSES: Necrotic ulcer, right lower extremity. Lack of intravenous access.   PROCEDURES PERFORMED: 1.  Sharp debridement, right lower extremity. 2.  Wound VAC placement.  3.  Placement of central venous access catheter.   ANESTHESIA: General.   SURGEON: Dr. Michela PitcherEly.  ASSISTANRedgie Grayer: [place], PA student.   OPERATIVE PROCEDURE: With the patient in the supine position, after the induction of appropriate general anesthesia, the patient'[name] leg was prepped with Betadine and draped with sterile towels. She was appropriately padded and positioned. The necrotic debris was removed with a combination of blunt and sharp dissection down through subcutaneous tissue and skin using scissors and a knife.  It was an excisional debridement. The area was then irrigated and wound VAC placed without difficulty. It sealed appropriately. The patient was then reprepped and draped, and the right neck was appropriately positioned. The right internal jugular vein was identified on ultrasound. The area was then prepped sterilely, draped sterilely, and the vein cannulated on a single pass under ultrasound guidance. A wire was passed in the great vessel system without difficulty. The skin was enlarged, the needle removed, dilator placed. The dilator was then removed, and a triple-lumen catheter was inserted over the wire into the great vessel system.  The catheter was flushed and secured with ports with caps. The catheter was then secured to the chest wall using 3-0 silk. A sterile dressing was applied. The patient was returned to the recovery room, and he tolerated the procedure well. Sponge and instrument count were correct x 2 in the operating room.     ____________________________ Quentin Orealph L. Ely III,  MD rle:dmm D: 12/26/2012 12:24:00 ET T: 12/26/2012 13:17:01 ET JOB#: [id number]  cc: Demetrios Isaacsonald E. Sherrie MustacheFisher, MD Quentin Orealph L. Ely III, MD, [name] Quentin OreALPH L ELY MD ELECTRONICALLY SIGNED 12/26/2012 15:15

## 2014-09-13 NOTE — Discharge Summary (Signed)
PATIENT NAME:  Brent [name], Brent [name] MR#:  [id number] DATE OF BIRTH:  [birth year]  DATE OF ADMISSION:  12/25/2012 DATE OF DISCHARGE:  01/12/2013  ADDENDUM:  To discharge summary dictated by Dr. Eliane DecreeS. Kiernan Farkas.  The patient got his insurance approval from [employer]. He is ready to go for short-term rehab. The patient remains hemodynamically stable.  ____________________________ [name], MD sap:sb D: 01/12/2013 13:49:26 ET T: 01/12/2013 14:49:48 ET JOB#: [id number]  cc: [name], MD, [name] [name] ELECTRONICALLY SIGNED 01/25/2013 29:5220:04

## 2014-09-13 NOTE — Discharge Summary (Signed)
PATIENT NAME:  [name], Brent [name] DATE OF BIRTH:  1956/04/06  This covers the period between August 20 and August 21, during which time I saw the patient.   FINAL DISCHARGE INSTRUCTIONS:  1.  Physical therapy.  2.  Diet:  2000 calorie ADA.  3.  Wound vac change every Tuesday and Friday.  4.  [facility]Fitzgerald[facility].  5.  Follow up with Dr. Michela PitcherEly in 10 days.  6.  Check PT/INR daily, and resume Coumadin once INR is less than 3.0.   HOME MEDICATION LIST:  1.  Cardizem ER 240 p.o. daily.  2.  Sliding scale insulin.  3.  Lopressor 150 b.i.d.  4.  Insulin NPH 20 units b.i.d.  5.  Percocet 10/325, 1 tablet q. 6 p.r.n.  6.  Nasal saline spray 2 sprays both nostrils q. 4 p.r.n.  7.  Warfarin 7.5 mg at 5:00 p.m., start when INR is less than 3.0. Check daily INR, goal is to keep INR between 2 and 3.  8.  Vitamin D 250,000 units orally weekly.  9.  Flexeril 10 mg p.o. q. 8 p.r.n.  10.  Doxycycline 100 mg b.i.d.  11.  Flagyl 500 mg b.i.d. for 8 more days.  12.  Lasix 40 mg daily.  13.  Glyburide 5 mg b.i.d.   HISTORY:  [name] is a 59 year old, morbidly obese, 450-pound Caucasian gentleman with history of type 2 diabetes, was admitted with:   1.  Sepsis due to right lower extremity cellulitis, status post wound VAC, wound debridement per Dr. Michela PitcherEly. The patient will complete doxycycline and Flagyl, per Dr. Jarrett AblesFitzgerald's recommendation. Follow up with Dr. Michela PitcherEly and Dr. Sampson GoonFitzgerald as outpatient. Wound VAC change every Tuesday and Friday.   2.  Acute on chronic renal failure, improving, likely ATN in the setting of vancomycin and Zosyn, which was stopped. The patient received IV fluids. Creatinine is slowly trending down, at discharge it was 3.45. The patient is making good urine.   3.  Hyperkalemia, improved. Lasix is resumed.   4.  Diarrhea, resolved. The patient's C. difficile is negative.   5. Right lower extremity gangrenous wound cellulitis  and necrotic ulcer. Underwent debridement by Dr. Michela PitcherEly during admission along with wound VAC placement.   6.  Chronic atrial fibrillation, on Coumadin. INR is on hold in the setting of antibiotics. INR is 3.9 at discharge. Will resume Coumadin once INR is less than 3.0. Nursing home to draw daily PT-INR.   7.  Type 2 diabetes, uncontrolled. Insulin NPH sliding scale and glimepiride were resumed.  8.  Morbid obesity. The patient counseled. Continue physical therapy and reduce weight.   Hospital stay otherwise remained stable. The patient remained a FULL CODE. The patient is going to be discharged to a rehabilitation center.   TIME SPENT:  40 minutes.    ____________________________ [name], MD sap:mr D: 01/11/2013 13:11:00 ET T: 01/11/2013 20:24:21 ET JOB#: [id number]  cc: Carmie Endalph L. Ely III, MD Stann Mainlandavid P. Sampson GoonFitzgerald, MD [name], MD, [name]    [name] MD ELECTRONICALLY SIGNED 01/25/2013 81:1920:04

## 2014-09-13 NOTE — Consult Note (Signed)
Brief Consult Note: Diagnosis: Necrotic LE wound in poorly controlled DM pt- s/p debridement x 2 with wound vac. Worsenig renal fxn possibly due to AIN from zosyn or ATN from vanco.   Patient was seen by consultant.   Consult note dictated.   Recommend further assessment or treatment.   Orders entered.   Comments: Cont surgical management of wound Avoid beta lactams Cont doxy.  Add flagyl for anaerobic coverage and cipro if worsens for pseudomonal coverage recheck CBC in am Thank you for consult. Will follow with you.  Electronic Signatures: Dierdre HarnessFitzgerald, Zarek Relph Patrick (MD)  (Signed 12-Aug-14 17:40)  Authored: Brief Consult Note   Last Updated: 12-Aug-14 17:40 by Dierdre HarnessFitzgerald, Marletta Bousquet Patrick (MD)

## 2014-09-14 NOTE — Discharge Summary (Signed)
PATIENT NAME:  Brent Brent [name], Brent Brent [name] MR#:  [id number] DATE OF BIRTH:  1955/10/06  DATE OF ADMISSION:  10/02/2013 DATE OF DISCHARGE: 10/08/2013   Please see interim discharge summary dictated by Dr. Elpidio AnisSudini yesterday on Oct 07, 2013 for hospital course up until that point.    FINAL DIAGNOSES:  1.  Acute on chronic diastolic heart failure with anasarca and scrotal edema.  2.  Hypertension.  3.  Pulmonary hypertension.  4.  Atrial fibrillation.  5.  Diabetes.  6.  Gout.  7.  Hypomagnesemia and hypokalemia.  8.  Cellulitis.  9.  Chronic kidney disease.   MEDICATIONS ON DISCHARGE: Include Cardizem CD 240 mg every 24 hours extended release 1 capsule daily, glyburide 5 mg twice a day, metoprolol tartrate 100 mg 1/2 tablet twice a day, allopurinol 100 mg 1 tablet daily, lisinopril 10 mg daily, Lovenox injections 40 mg subcutaneous injection every 12 hours, Levemir 30 units subcutaneous injection once a day, insulin aspart 4 units subcutaneous injection 3 times a day before meals, nystatin powder 1 application twice a day, Lasix 40 mg orally twice a day, potassium chloride 20 mEq once a day, MiraLax 17 grams orally once a day as needed for constipation, magnesium oxide 400 mg 2 tablets once a day, Levaquin 500 mg once a day for 9 days and then stop and aspirin 325 mg orally daily.   HOME OXYGEN: 2 L nasal cannula.   DIET: Low-sodium, carbohydrate-controlled diet, regular consistency.   ACTIVITY: As tolerated with physical therapy.  FOLLOWUP: In 1 to 2 days with doctor at rehab.   HOSPITAL COURSE: Please see interim discharge summary dictated May 17 for hospital course up until that point. Overnight, the patient did well. Labs on the 18th showed a magnesium of 1.7, potassium 3.3, creatinine 1.24. The patient was given oral magnesium replacement and IV magnesium replacement prior to discharge and oral potassium replacement. The patient'Brent [name] creatinine actually improved from presentation. The patient'Brent [name] weight  today is 466, which is down from 540 pounds on presentation.   I do recommend a sleep study as an outpatient as likely the patient has sleep apnea and a restrictive pattern secondary to weight, I do recommend checking electrolytes as an outpatient, magnesium, potassium and BMP on Thursday and possibly every week while in rehab.   I did discuss weight loss with the patient. The patient'Brent [name] BMI is 56.8. The patient will need weight loss and should consider a bariatric surgeon consultation as an outpatient.   I did start the patient on oral Levaquin. His right lower extremity is draining and slightly erythematous. He did have a history of a wound VAC there in the past. The drainage could be secondary to the edema.  TIME SPENT ON DISCHARGE: 35 minutes.  ____________________________ Herschell Dimesichard J. Renae GlossWieting, MD rjw:aw D: 10/08/2013 08:54:20 ET T: 10/08/2013 09:06:41 ET JOB#: [id number]  cc: Herschell Dimesichard J. Renae GlossWieting, MD, [name] Salley ScarletICHARD J Shelagh Rayman MD ELECTRONICALLY SIGNED 10/10/2013 14:03

## 2014-09-14 NOTE — H&P (Signed)
PATIENT NAME:  Brent [name], Brent [name] MR#:  [id number] DATE OF BIRTH:  12-09-1955  DATE OF ADMISSION:  10/02/2013  PRIMARY CARE PROVIDER: Dr. Lelon Huh.  EMERGENCY DEPARTMENT REFERRING PHYSICIAN: Dr. Jimmye Norman.   CHIEF COMPLAINT: Scrotal swelling, lower extremity swelling.   HISTORY OF PRESENT ILLNESS: The patient is a morbidly obese male with a BMI of 66.1 who was last hospitalized in August. At that time was admitted with a sepsis due to right lower extremity cellulitis, acute on chronic renal failure with right lower extremity gangrenous wound. Had a wound VAC at that time. Who is coming to the ED with complaint of severe scrotal swelling. The patient states that over 1 week his scrotum is swelling. Also has significant lower extremity swelling in the legs. He denies shortness of breath but currently he is on 90% room air. He denies any chest pains. Complains of feeling cold. Denies any nausea, vomiting or diarrhea. Denies any urinary symptoms. The patient has chronic flaking wounds in his bilateral lower extremity with some dry ulceration.   PAST MEDICAL HISTORY: Significant for diabetes, hypertension, chronic kidney disease stage III, history of nephrolithiasis, gout, GERD, morbid obesity, bedbound state, history of afib, history of sepsis due to a lower extremity wound, history of wound VAC placement.  PAST SURGICAL HISTORY: None.   ALLERGIES: None.  CURRENT MEDICATIONS: Allopurinol 100 daily, Cardizem CD 240 one tab p.o. daily, glyburide 5 mg 1 tab p.o. b.i.d., Humulin N 30 units b.i.d., metoprolol tartrate 50 mg b.i.d.  SOCIAL HISTORY:  Does not smoke, does not drink. He lives with his wife.   FAMILY HISTORY: Father was an alcoholic. There is also a history of diabetes in the family.  REVIEW OF SYSTEMS:   CONSTITUTIONAL: Denies any fevers. Complains of fatigueness, weakness. No weight loss. No weight gain.  EYES: No blurred or double vision. No pain. No redness. No inflammation. No  glaucoma. No cataracts.  ENT: No tinnitus. No ear pain. No hearing loss. No seasonal allergies. No difficulty swallowing.  RESPIRATORY: Denies any cough, wheezing, hemoptysis. No COPD.  CARDIOVASCULAR: Denies any chest pain, orthopnea. Complains of significant edema. Complains of dyspnea on exertion. No syncope.  GASTROINTESTINAL: No nausea, vomiting, diarrhea.  No abdominal pain. No hematemesis. No melena. Has a history of GERD.  ENDOCRINE: Denies any polyuria or nocturia. HEMATOLOGIC AND LYMPHATIC:  Denies history of anemia. No easy bruisability or bleeding.  SKIN: He has chronic ulceration of the lower extremity.  MUSCULOSKELETAL: Pain related to osteoarthritis. Has history of gout. No CVA, TIA or seizures.  PSYCHIATRIC: No anxiety, insomnia or ADD.   PHYSICAL EXAMINATION: VITAL SIGNS: Temperature 97.8, pulse 70, respirations 24, blood pressure 156/84, O2 90%.  GENERAL: The patient is a very morbidly obese male. Appears chronically ill.  HEENT: Head atraumatic, normocephalic. Pupils equally round, reactive to light and accommodation. There is no conjunctival pallor. No scleral icterus. Nasal exam shows no drainage or ulceration. Oropharynx is clear without any exudate.  NECK: Supple without any JVD.  CARDIOVASCULAR: Regular rate and rhythm. No murmurs, rubs, clicks or gallops.  LUNGS: Clear to auscultation bilaterally without any rales, rhonchi, wheezing.  ABDOMEN: Obese. He has some abdominal swelling with edema in the abdomen. EXTREMITIES:  He has 3+ edema. Hard to appreciate DP, PT pulses.  GENITOURINARY: He has significant amount of scrotal swelling with some erythema. There is no warmth. He also has groin, likely Candida.  SKIN: Bilateral lower extremity has ulceration which the ulcer has no drainage. It is flaking skin.  PSYCHIATRIC: Not anxious or depressed.  NEUROLOGIC: Awake, alert, oriented x 3. No focal deficits.   LABORATORY DATA: Glucose 165, BUN 21, creatinine 1.54. Sodium  143, potassium 4.1, chloride 107, CO2 of 33, calcium 8.9. LFTs: Albumin of 2.8, bili total 1.0, alk phos is 155, AST , ALT 18. Troponin less than 0.02. WBC 8.7, hemoglobin 12.6, platelet count 332, INR 1.2. Scrotal ultrasound shows marked scrotal swelling and edema. No loculated fluid.   ASSESSMENT AND PLAN: The patient is a 59 year old morbidly obese white male with history of systolic dysfunction in the past based on echo 2012, presents with severe scrotal swelling, leg swelling, abdominal swelling. 1.  Scrotal swelling associated with other areas of the antibody with anasarca, likely due to acute systolic congestive heart failure. At this time, we will treat him with IV Lasix. The patient may also have possible cellulitis involving the scrotum. I will treat him with IV Zosyn and vancomycin.  We will get urology evaluation. We will also get an echocardiogram of the heart since the last echo was in 2012.  2.  Diabetes. We will place him on sliding scale. Continue his glyburide. I am going to switch his  NPH to Levemir. 3.  History of atrial fibrillation. Continue Cardizem and aspirin. 4.  Gout. We will continue allopurinol.  5.  Miscellaneous. The patient will be on Lovenox for deep vein thrombosis prophylaxis.  TIME SPENT: 50 minutes.    ____________________________ Lafonda Mosses Posey Pronto, MD shp:ce D: 10/02/2013 18:55:25 ET T: 10/02/2013 19:20:05 ET JOB#: [id number]  cc: Daymond Cordts H. Posey Pronto, MD, [name] Alric Seton MD ELECTRONICALLY SIGNED 10/08/2013 12:13

## 2014-09-14 NOTE — Consult Note (Signed)
PATIENT NAME:  Brent [name], Brent [name] MR#:  [id number] DATE OF BIRTH:  Apr 10, 1956  DATE OF CONSULTATION:  10/03/2013  REFERRING PHYSICIAN:  Shreyang H. Allena KatzPatel, MD CONSULTING PHYSICIAN:  Brent ConnersMichael R. Evelene CroonWolff, MD  REASON FOR CONSULTATION: Scrotal edema.   HISTORY OF PRESENT ILLNESS: Mr. Brent [name] is a 59 year old white male admitted to the hospital with lower extremity swelling and scrotal swelling. He had sepsis in August 2014 related to cellulitis of the right lower extremity. He has severe and morbid obesity and has had edema of his lower extremities for some time now. He denies scrotal pain. He had a scrotal ultrasound performed on May 12th, which by report was consistent with normal testes and scrotal edema.   ALLERGIES: No drug allergies.   CHRONIC HOME MEDICATIONS: Include allopurinol, Cardizem CD, glyburide, Humulin insulin and metoprolol.   PREVIOUS SURGICAL PROCEDURES: Include placement of a right lower extremity wound VAC in 2014.   SOCIAL HISTORY: The patient denied tobacco or alcohol use.   FAMILY HISTORY: Remarkable for diabetes.   PAST AND CURRENT MEDICAL CONDITIONS:  1. Diabetes.  2. Hypertension.  3. Chronic renal insufficiency, stage III.  4. History of kidney stones.  5. Gout.  6. GERD.  7. Morbid obesity.  8. Atrial fibrillation.   REVIEW OF SYSTEMS: The patient denied difficulty voiding.   PHYSICAL EXAMINATION: The patient had massive anasarca extending to above the umbilicus with very tight thighs. He also had anasarca and edema involving the scrotum. There was no fluctuance, tenderness or cellulitis present in the scrotum.   IMPRESSION: Scrotal edema related to systemic anasarca.   SUGGESTIONS:  1.  Elevate scrotum and legs.  2. Treat underlying anasarca.   ____________________________ Brent ConnersMichael R. Evelene CroonWolff, MD mrw:lb D: 10/03/2013 12:32:49 ET T: 10/03/2013 12:38:55 ET JOB#: [id number]  cc: Brent ConnersMichael R. Evelene CroonWolff, MD, [name] Orson ApeMICHAEL R WOLFF MD ELECTRONICALLY SIGNED  10/03/2013 14:33

## 2014-09-14 NOTE — Consult Note (Signed)
Brief Consult Note: Diagnosis: Scrotal edema/anasarca.   Patient was seen by consultant.   Consult note dictated.   Discussed with Attending MD.   Comments: Scrotal edema is due to generalized anasarca. Treat underlying cause and scrotal/leg elevation.  Electronic Signatures: Orson ApeWolff, Jakeel Starliper R (MD)  (Signed 13-May-15 12:28)  Authored: Brief Consult Note   Last Updated: 13-May-15 12:28 by Orson ApeWolff, Johny Pitstick R (MD)

## 2014-09-15 NOTE — Consult Note (Signed)
PATIENT NAME:  Brent [name], Malic S MR#:  [id number] DATE OF BIRTH:  1956/04/08  DATE OF CONSULTATION:  04/08/2011  REFERRING PHYSICIAN:  Prime Doc  CONSULTING PHYSICIAN:  Machelle Raybon D. Juliann Paresallwood, MD  PRIMARY CARE PHYSICIAN: Mila Merryonald Fisher, MD   INDICATION:  1. Syncope.  2. Atrial fibrillation.  3. Shortness of breath.   HISTORY OF PRESENT ILLNESS: Mr. Brent [name] is a 59 year old white male with history of hypertension, diabetes, renal insufficiency, gout, obesity, osteoarthritis, and atrial fibrillation who states he has been in his normal state, was on the couch watching TV and reportedly had what sounds like a syncopal episode. He fell asleep or passed out. He woke up on the floor. He could not feel sensation of his heart beating fast, skipping or slow. He had significant bilateral hip pain and knee pain. He had recently seen his primary doctor for orthopedic evaluation but he did not complain of any worsening pain in his knee or hip prior to the syncopal episode. Denied any significant chest pain at the time. Does not remember anything before he woke up and before he was family called rescue and brought him in.   REVIEW OF SYSTEMS: Positive blackout spell. Positive syncope. No nausea, vomiting, fever. No chills, sweats. No weight loss, no weight gain. No hemoptysis, hematemesis. No bright red blood per rectum. No vision change or hearing change. No sputum production or cough.   PAST MEDICAL HISTORY:  1. Diabetes. 2. Hypertension. 3. Renal insufficiency.  4. Nephrolithiasis.  5. Gout.  6. Gastroesophageal reflux disease.  7. Morbid obesity.  8. Osteoarthritis.  9. Reported history of atrial fibrillation.   PAST SURGICAL HISTORY: None.  FAMILY HISTORY: Alcoholism, diabetes.   SOCIAL HISTORY: Disabled[place]. Chronic pain. No smoking or alcohol consumption.   MEDICATIONS:  1. Aspirin 325 a day.  2. Byetta 10 mcg 1 to 2 tablets daily. 3. Lantus 42 units twice a  day.  4. HCTZ 12.5 a day.  5. Protonix 40 a day.  6. Allopurinol 100 a day. 7. Meloxicam 15 a day. 8. Benazepril 40 a day. 9. Glyburide 5 mg 1 to 2 tablets daily. 10. Metoprolol 50 mg 1 to 2 daily. 11. Coumadin 10 mg Tuesday, Thursday, Saturday; 7.5 Monday, Wednesday, Friday, and Sunday.  12. Lasix 20 mg 1 to 2 daily.  13. Metanx as needed.  14. Colchicine 0.6 mg. 15. Hydrocodone/APAP 7.5/750 p.r.n.   PHYSICAL EXAMINATION:   VITAL SIGNS: Blood pressure 195/88, pulse 120 and irregular, respiratory rate 20, afebrile.   HEENT: Normocephalic, atraumatic. Pupils reactive to light.  NECK: Supple. No JVD, bruit, adenopathy.    LUNGS: Clear to auscultation and percussion. No significant wheeze, rhonchi, or rales.   HEART: Tachycardic, irregular irregular. Systolic ejection murmur left sternal border. PMI nondisplaced.   ABDOMEN: Benign.   EXTREMITIES: Within normal limits.   NEUROLOGIC: Intact.   SKIN: Normal.  LABORATORY, DIAGNOSTIC, AND RADIOLOGICAL DATA: CK 417, MB 8.5, troponin 0.03. Glucose 346, BUN 20, creatinine 1.62, sodium 136, potassium 4, chloride 97, CO2 29, calcium 9.2. LFTs within normal limits. White count 17.7, hemoglobin 16, hematocrit 48, platelet count 289. EKG atrial fibrillation, rate of 140, irregular.   ASSESSMENT:  1. Rapid atrial fibrillation. 2. Syncope. 3. Renal insufficiency.  4. Morbid obesity.  5. Diabetes.  6. Reflux.  7. Renal insufficiency   PLAN:  1. Agree with rule out for myocardial infarction. 2. Place on telemetry.  3. Follow-up EKG.  4. Check PT/INR.  5. Rate control  for atrial fibrillation.  6. Consider sleep study for obstructive sleep apnea.  7. Aggressive blood pressure control as his blood pressure is poorly controlled at this point with systolics above 200. 8. Diabetes management. 9. Continue reflux therapy.  10. Lasix therapy.  11. Recommend continued therapy for his gout.  12. Echocardiogram will be helpful.  13. Do not  recommend cardiac cath. 14. Again, will follow cardiac enzymes. 15. Will treat the patient medically for now.  16. I am not sure if of the syncope is related to cardiac issues but further evaluation may be necessary.  17. I do not recommend EP study at this point.   ____________________________ Bobbie Stack. Juliann Pares, MD ddc:drc D: 04/10/2011 19:59:00 ET T: 04/11/2011 15:24:45 ET JOB#: [id number]  cc: Janeece Blok D. Juliann Pares, MD, [name] Alwyn Pea MD ELECTRONICALLY SIGNED 05/28/2011 13:53

## 2014-12-07 IMAGING — US US EXTREM LOW VENOUS*R*
1 series · 14 of 22 positions shown · non-contrast
Comparison: none

REASON FOR EXAM: swelling, erythema
COMMENTS:

PROCEDURE:     US  - US DOPPLER LOW EXTR RIGHT  - December 25, 2012  [DATE]
RESULT:     Comparison: None

[Series 1: us extrem low venous*right* · 0.12mm/px · 14 of 22 slices shown]
[im 1/22]
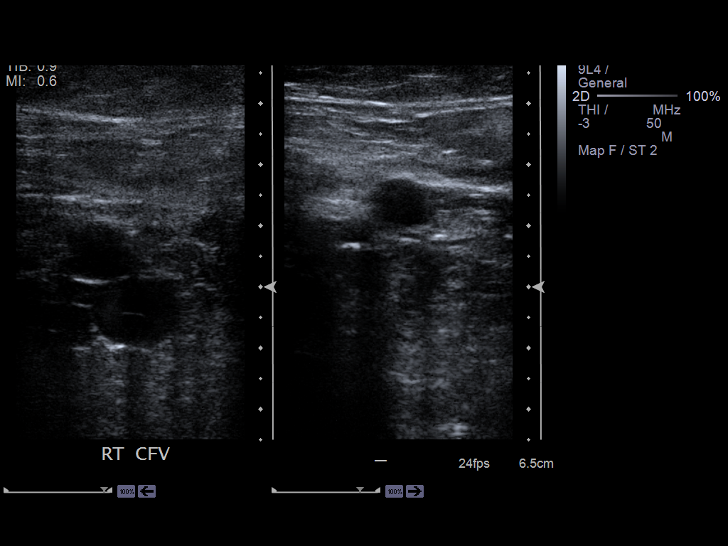
[im 3/22]
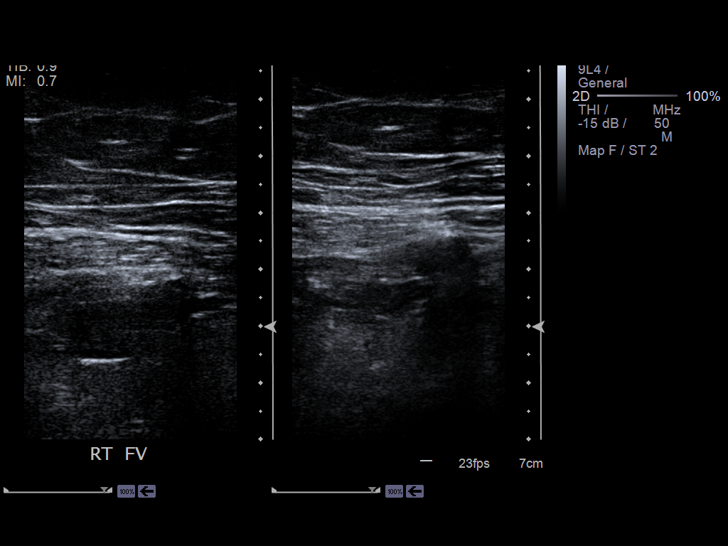
[im 4/22]
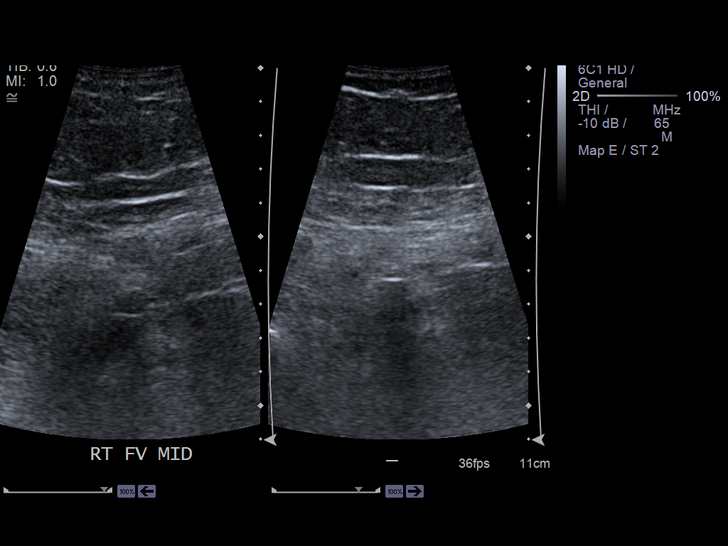
[im 6/22]
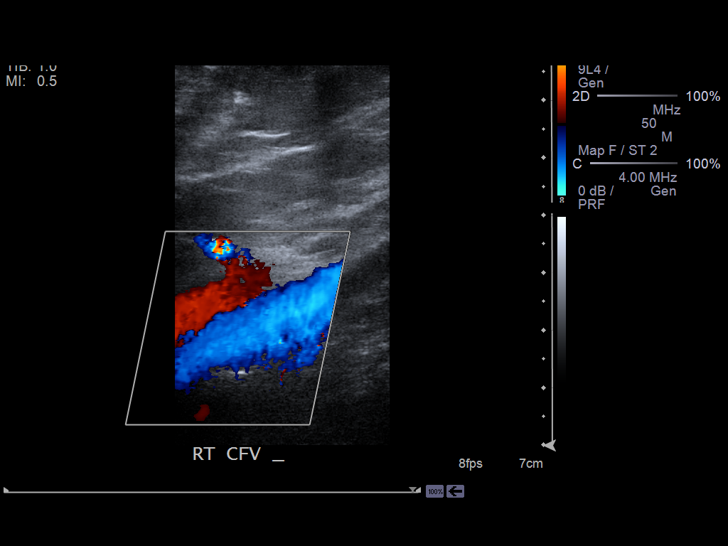
[im 8/22]
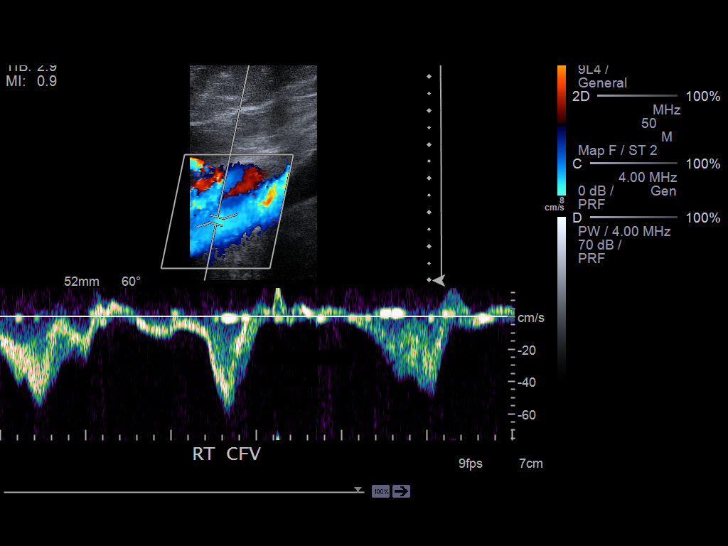
[im 9/22]
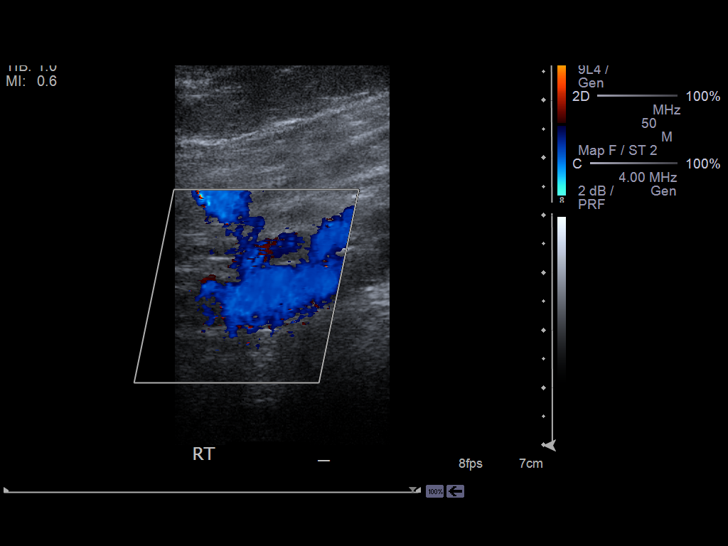
[im 11/22]
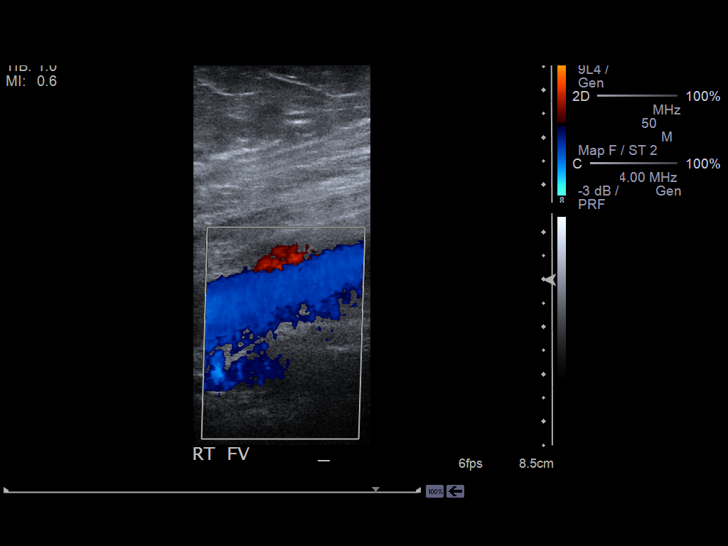
[im 12/22]
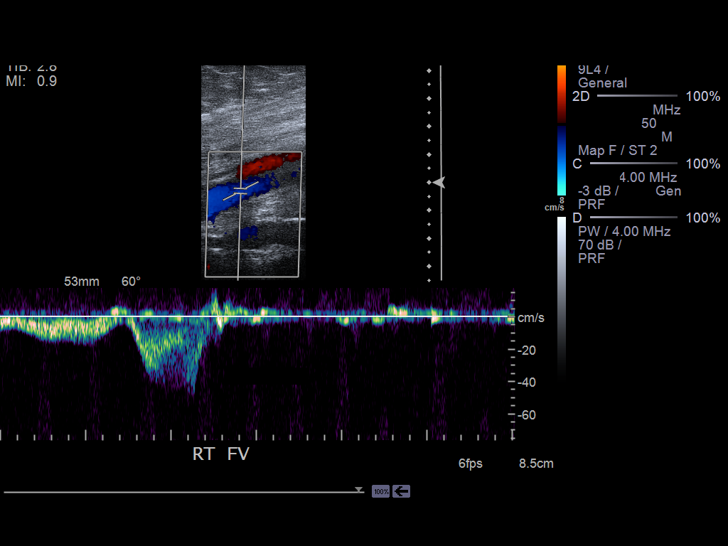
[im 14/22]
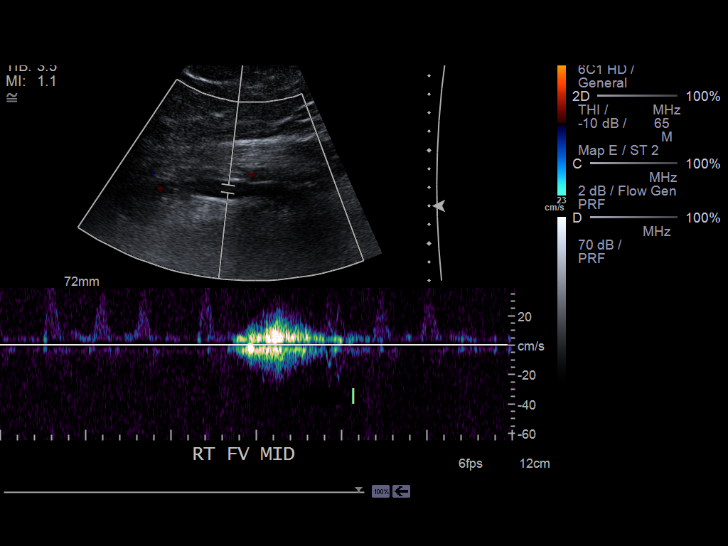
[im 15/22]
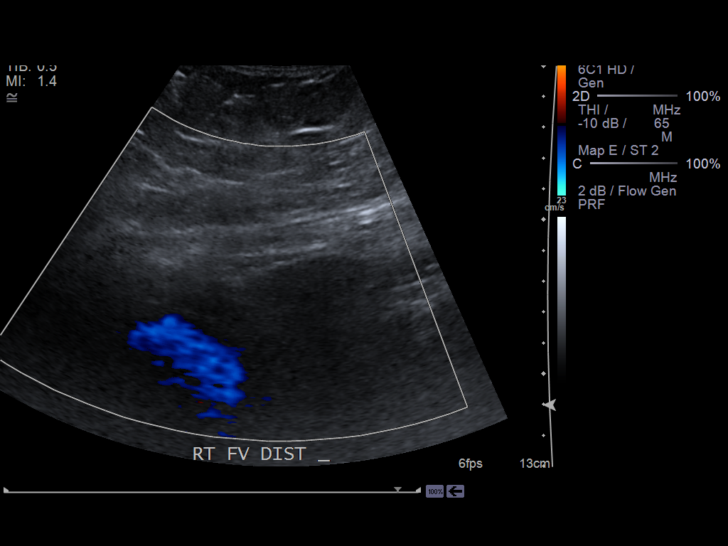
[im 17/22]
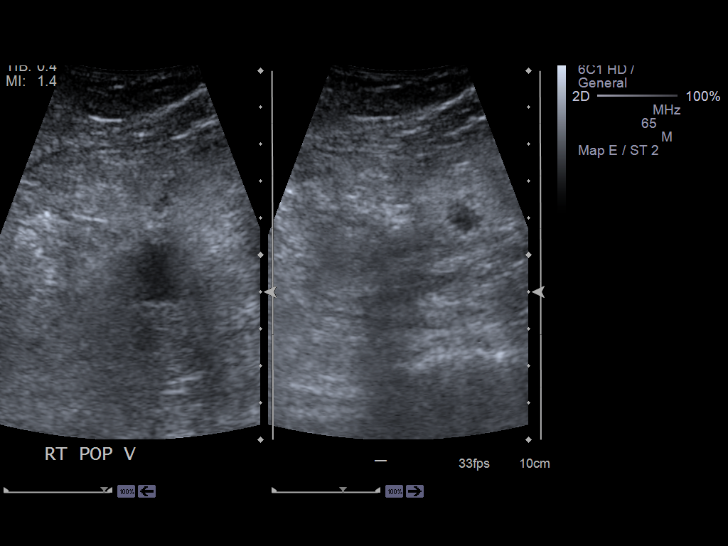
[im 19/22]
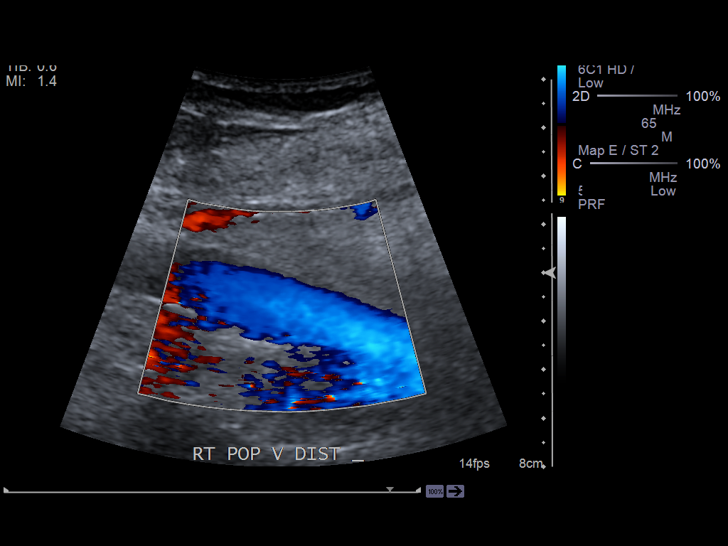
[im 20/22]
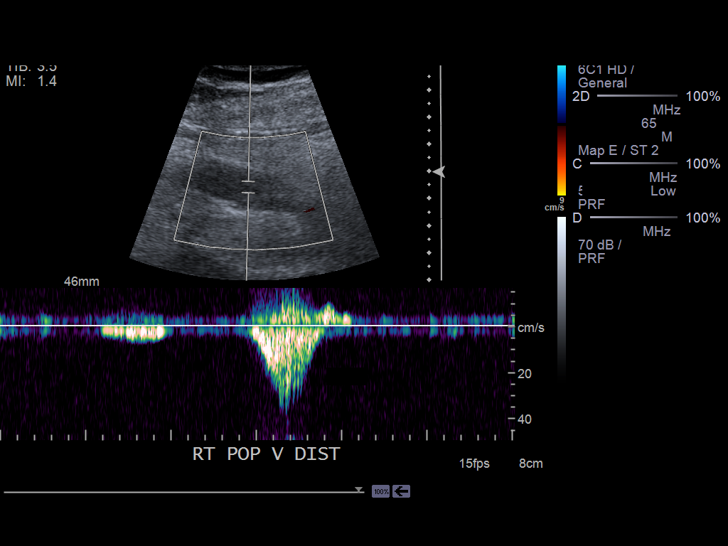
[im 22/22]
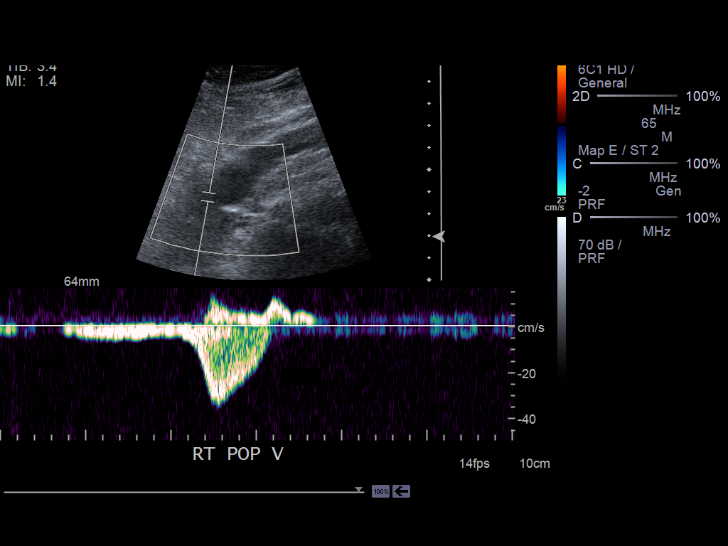

[14 of 22 positions shown; findings below may reference images not displayed]

FINDINGS: Multiple longitudinal and transverse gray-scale as well as color
and spectral Doppler images of the right lower extremity veins were obtained
from the common femoral veins through the popliteal veins.

The right common femoral, greater saphenous, femoral, popliteal veins, and
venous trifurcation are patent, demonstrating normal color-flow and
compressibility. No intraluminal thrombus is identified.There is normal
respiratory variation and augmentation demonstrated at all vein levels.
IMPRESSION: No evidence of DVT in the right lower extremity.

[REDACTED]

## 2014-12-07 IMAGING — CR DG CHEST 1V PORT
1 series · 1 of 1 positions shown · non-contrast
Comparison: none

REASON FOR EXAM: weakness
COMMENTS:

[ap]
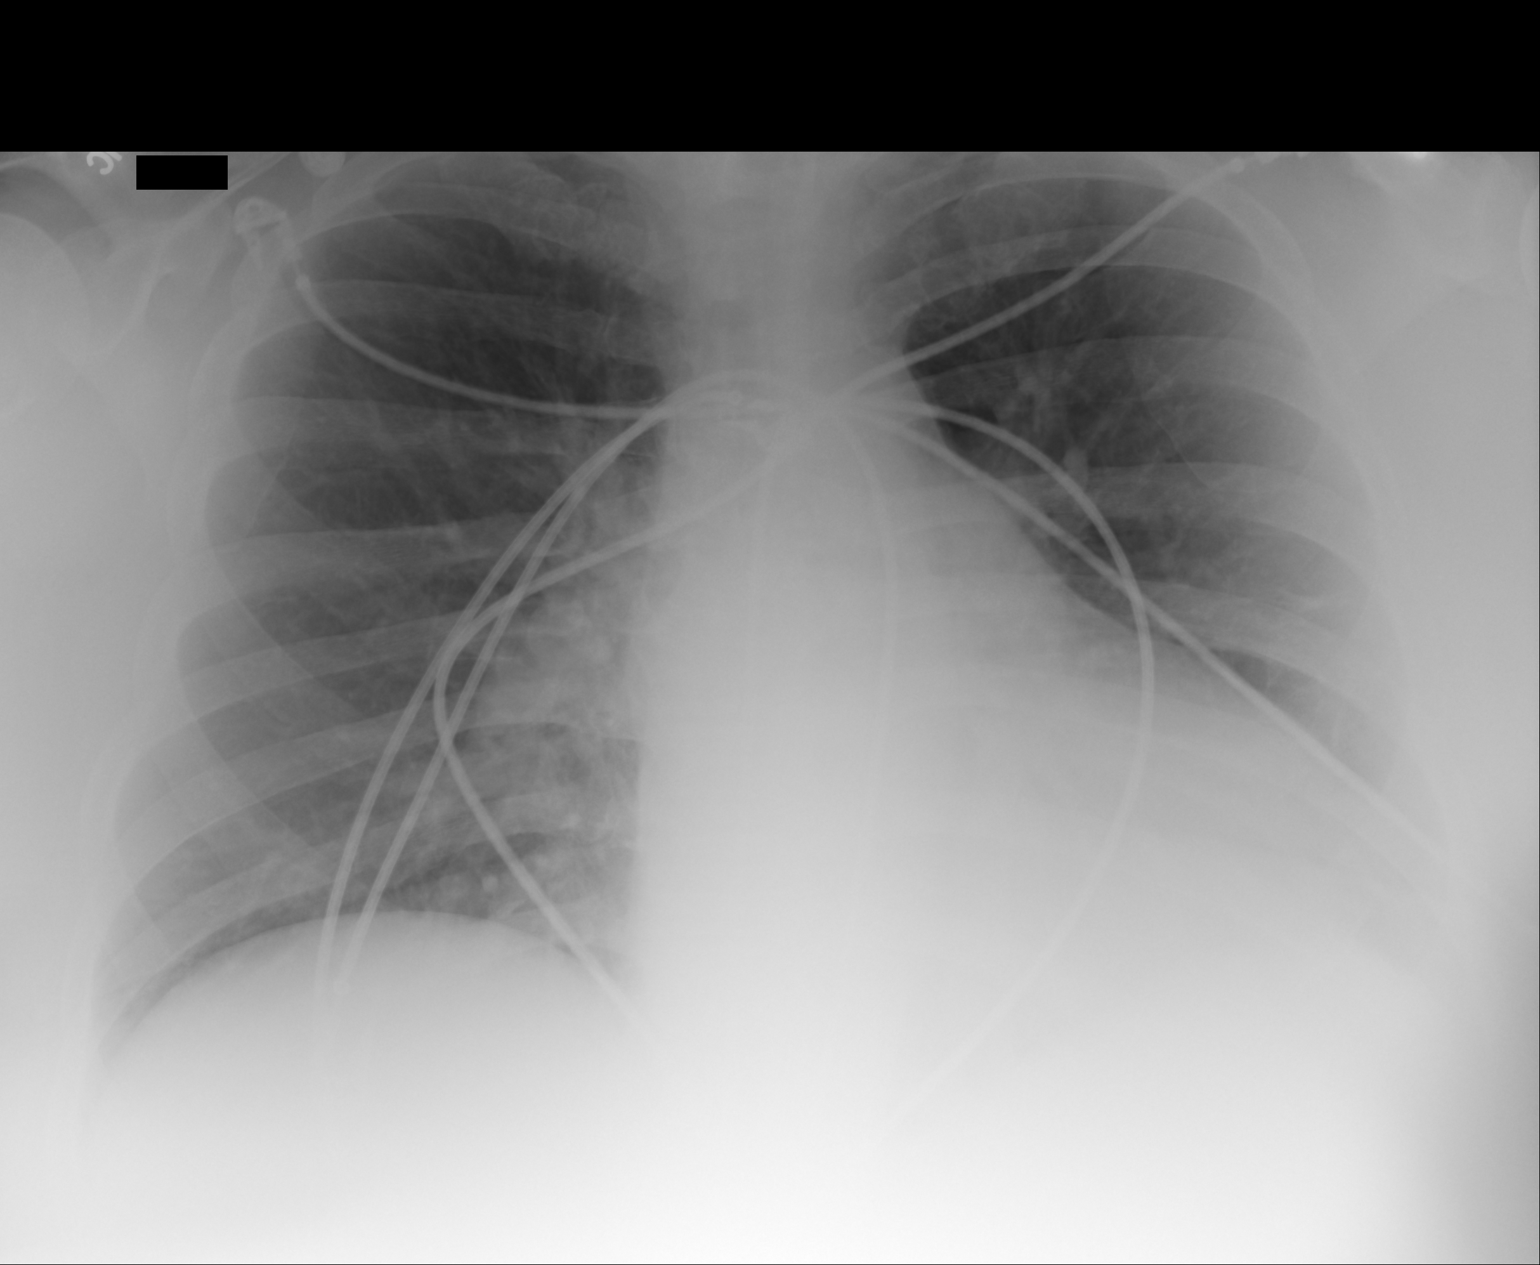

[1 of 1 positions shown; findings below may reference images not displayed]

PROCEDURE:     DXR - DXR PORTABLE CHEST SINGLE VIEW  - December 25, 2012  [DATE]

RESULT:     Comparison is made to the study of 12/26/2011.

The projection is lordotic. Cardiac monitoring electrodes present. The
cardiac silhouette is borderline to mildly enlarged. There is hilar
prominence which may be projectional. Correlate clinically. The lungs are
otherwise clear.
IMPRESSION: Lordotic projection. Possible perihilar infiltrate versus
artifact on the right. Borderline to mild cardiomegaly. Followup PA and
lateral views for further evaluation.

[REDACTED]

## 2014-12-10 IMAGING — US US RENAL KIDNEY
1 series · 14 of 25 positions shown · non-contrast
Comparison: none

REASON FOR EXAM: arf
COMMENTS:

[Series 1: us renal kidney · 0.29mm/px · 14 of 34 slices shown]
[im 1/34]
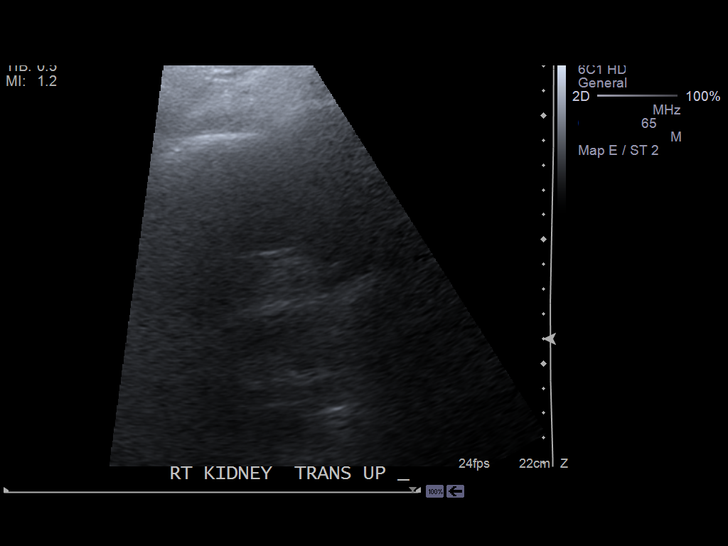
[im 3/34]
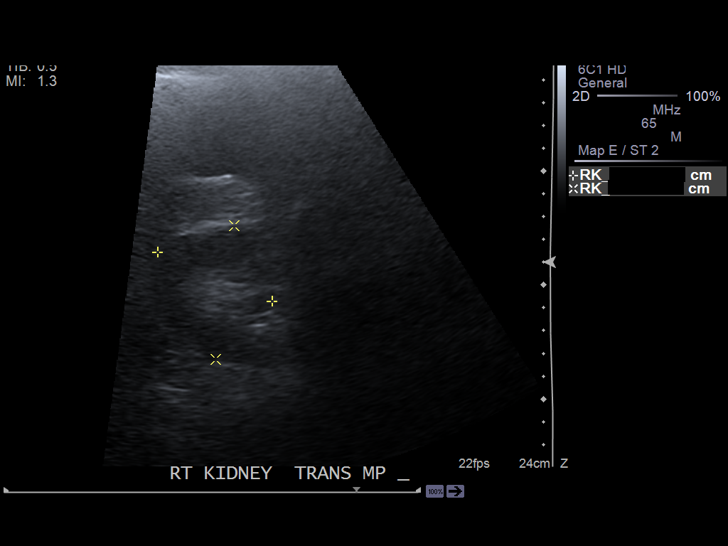
[im 6/34]
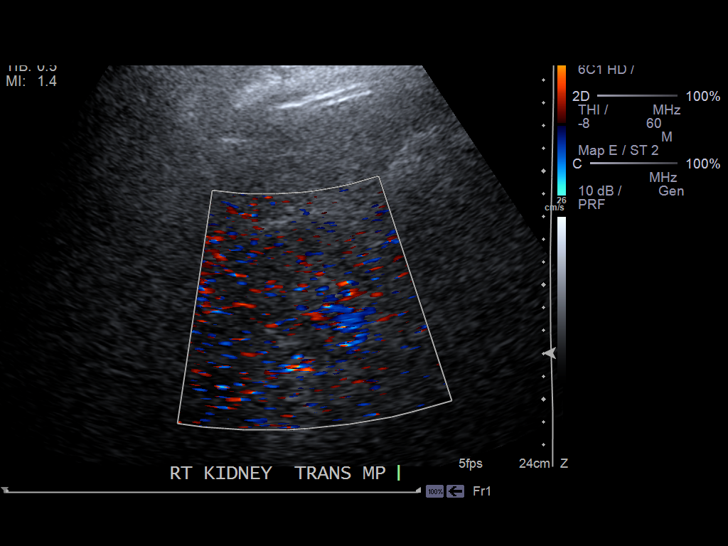
[im 9/34]
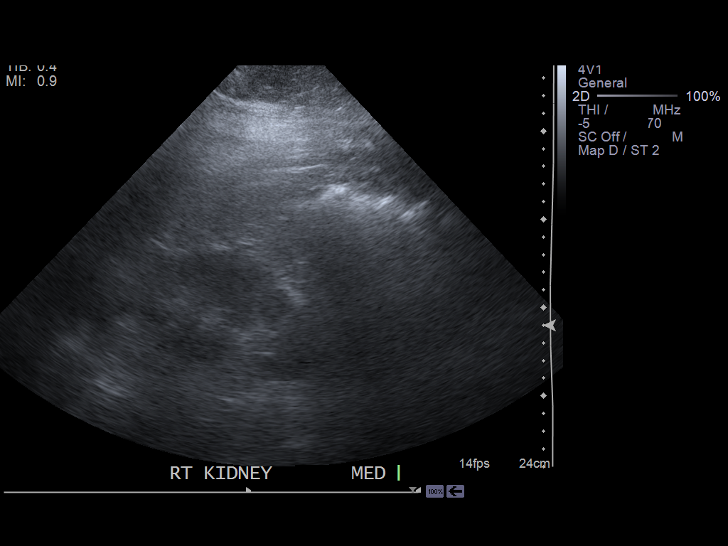
[im 12/34]
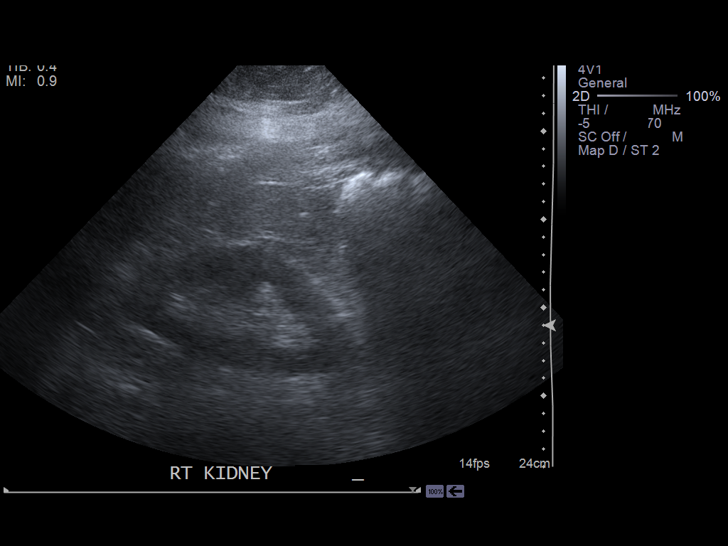
[im 13/34]
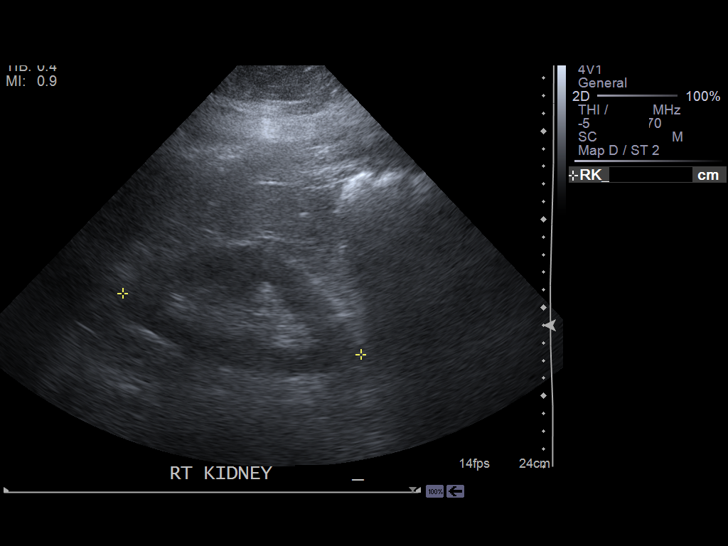
[im 16/34]
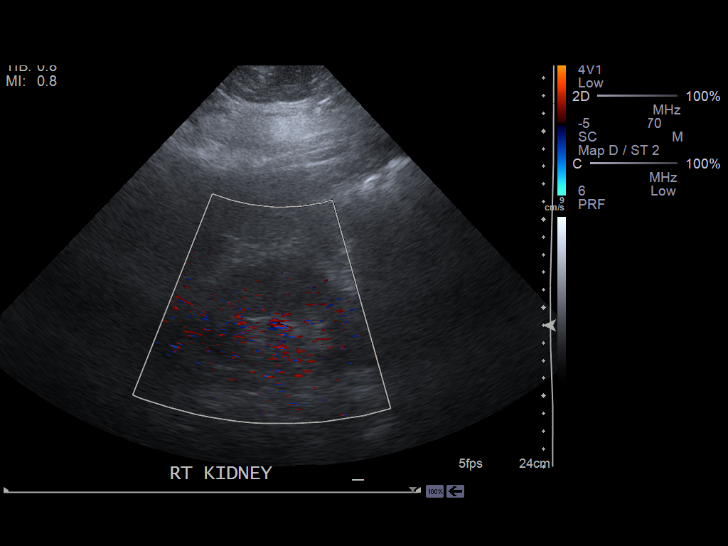
[im 18/34]
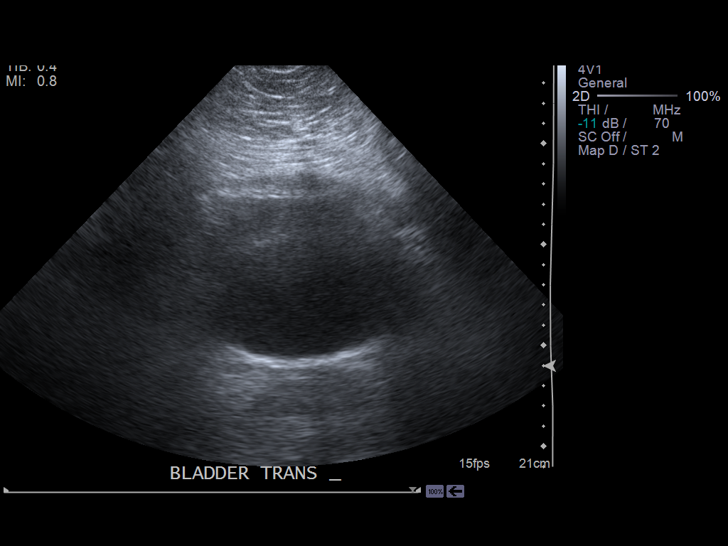
[im 21/34]
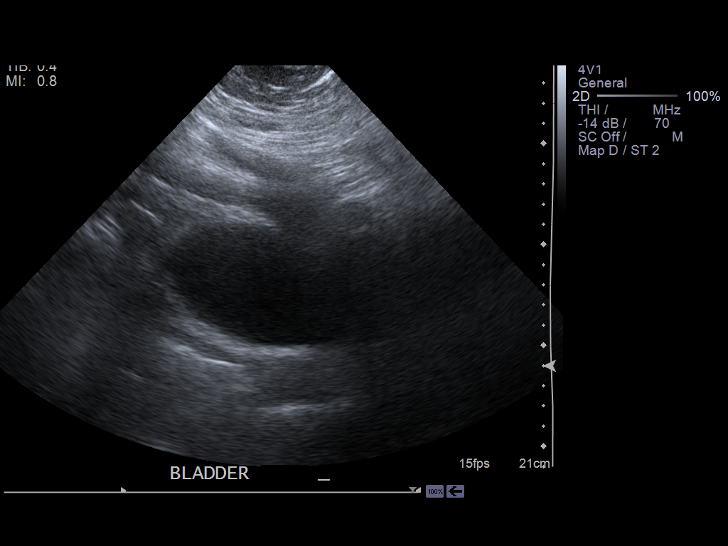
[im 23/34]
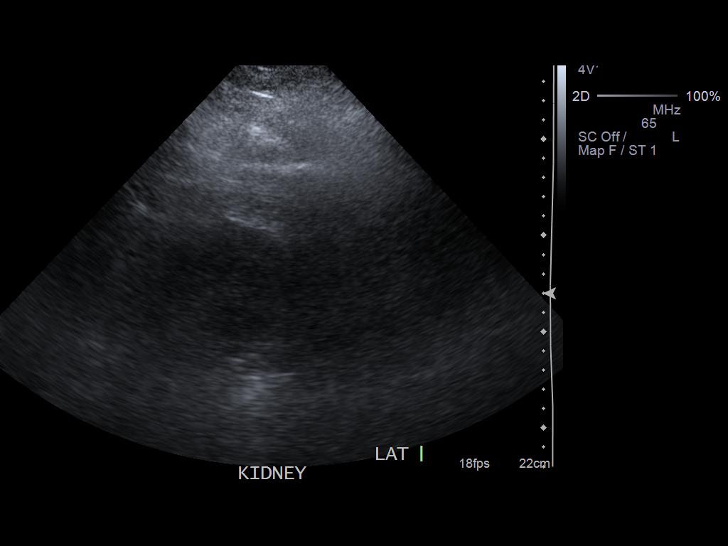
[im 25/34]
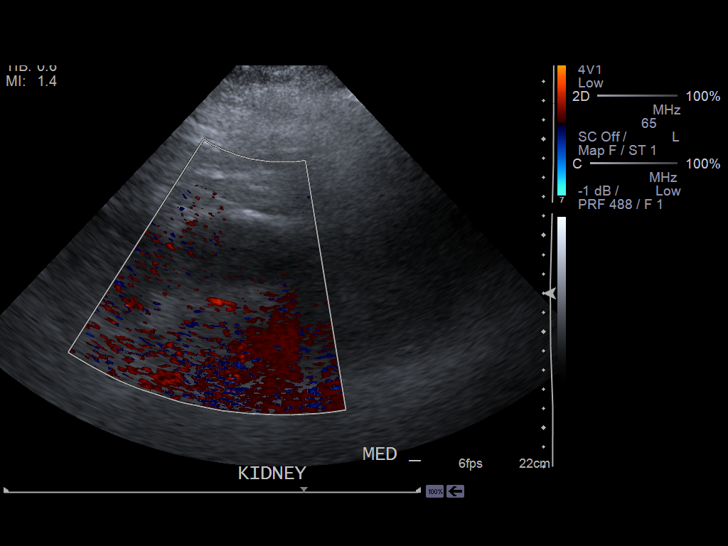
[im 28/34]
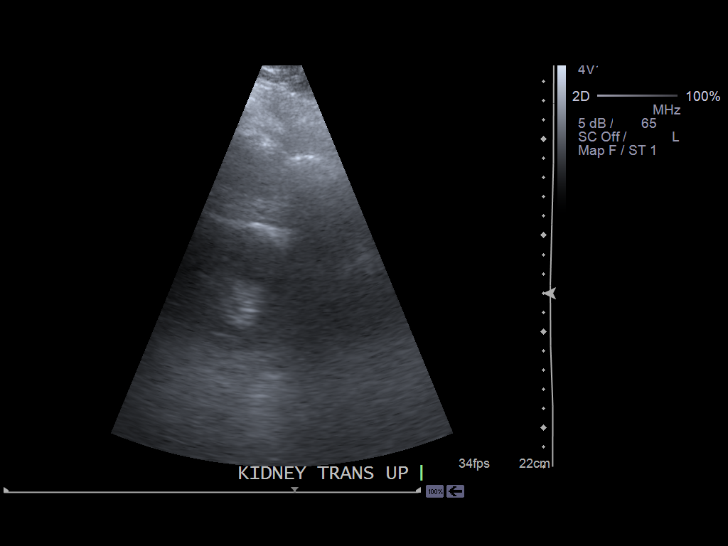
[im 31/34]
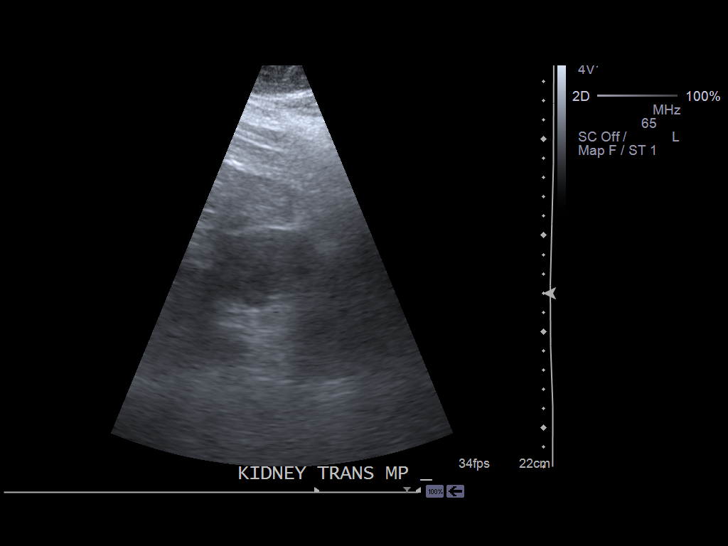
[im 34/34]
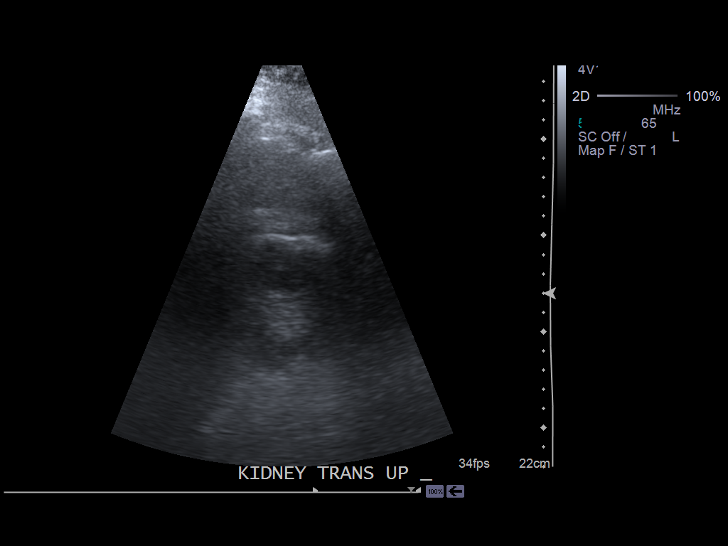

[14 of 25 positions shown; findings below may reference images not displayed]

PROCEDURE:     US  - US KIDNEY  - December 28, 2012  [DATE]

RESULT:     Comparison: None

Technique and findings: Multiple gray-scale and color doppler images of the
kidneys were obtained.

The right kidney measures 13.9 x 5.46 x 5.92 cm and the left kidney measures
12.7 x 6.7 x 6.8 cm. The kidneys are normal in echogenicity. There is no
hydronephrosis.  There are no echogenic foci.  There are no renal masses.
There is no free fluid in the region of the renal fossa.
IMPRESSION: Normal renal ultrasound.

[REDACTED]

## 2015-09-14 IMAGING — US US SCROTUM W/ DOPPLER COMPLETE
1 series · 13 of 25 positions shown · non-contrast
Comparison: Prior ultrasound from 12/23/2011

CLINICAL DATA: Bilateral swelling and edema

EXAM:
SCROTAL ULTRASOUND
DOPPLER ULTRASOUND OF THE TESTICLES
TECHNIQUE: Complete ultrasound examination of the testicles, epididymis, and
other scrotal structures was performed. Color and spectral Doppler
ultrasound were also utilized to evaluate blood flow to the
testicles.

[Series 1: us scrotum w/ doppler complete · 0.12mm/px · 13 of 64 slices shown]
[im 1/64]
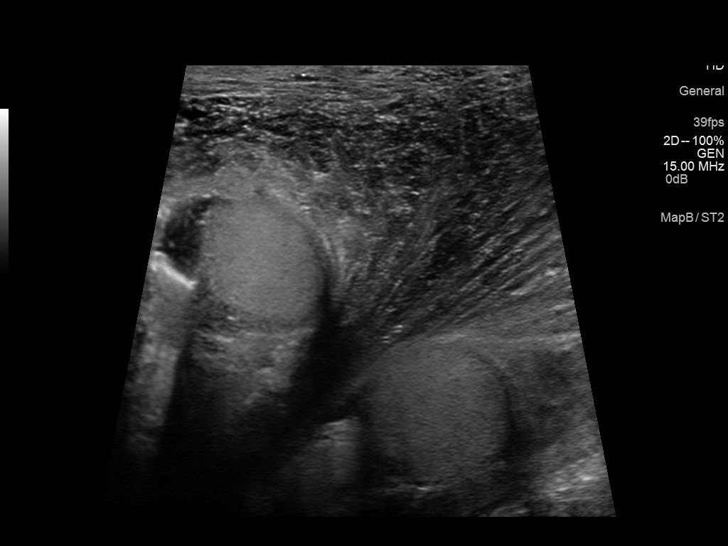
[im 6/64]
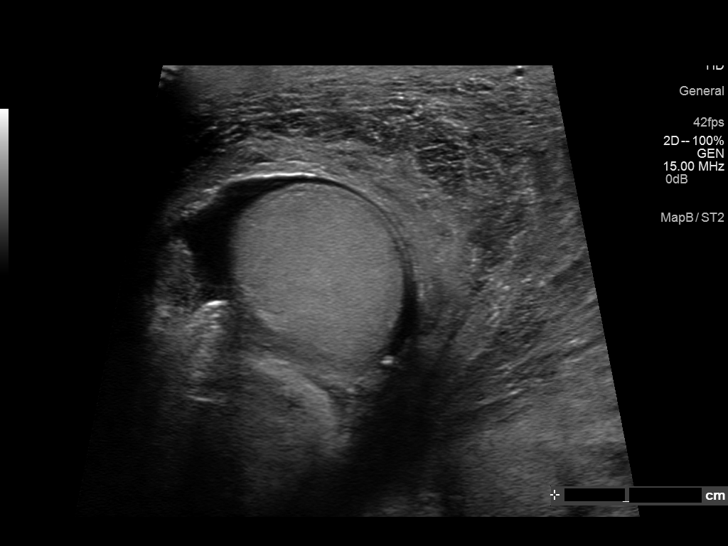
[im 11/64]
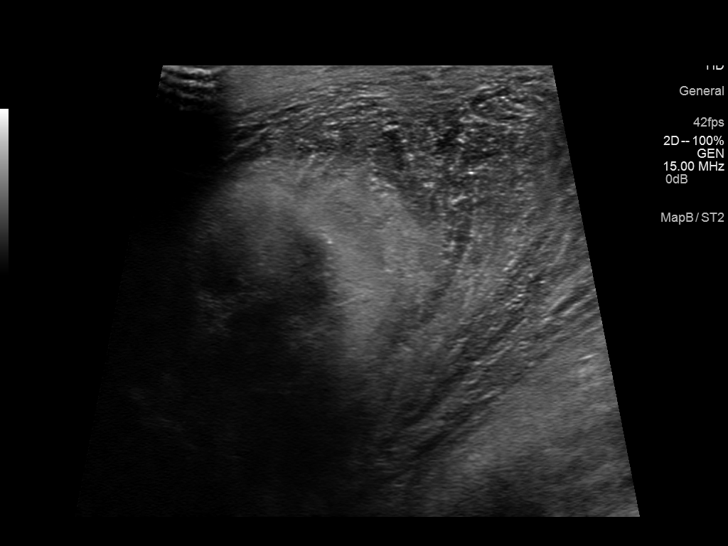
[im 16/64]
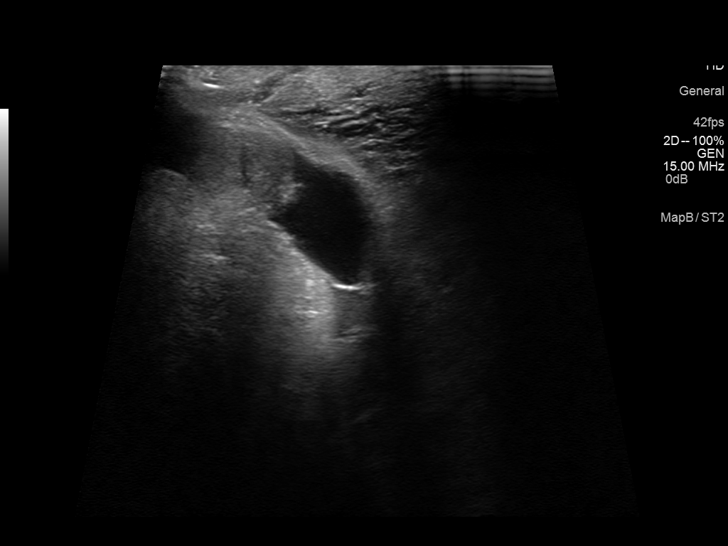
[im 22/64]
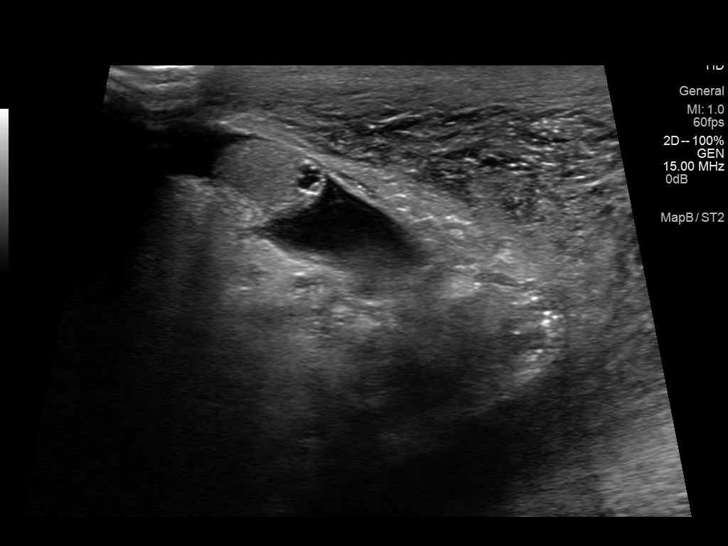
[im 27/64]
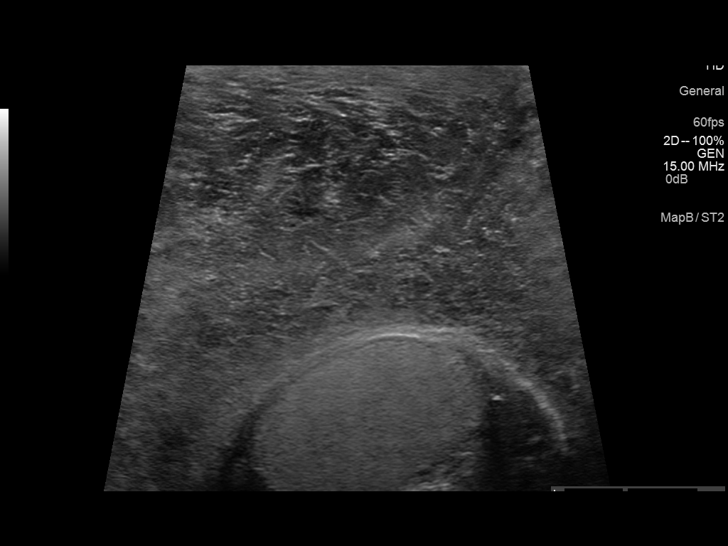
[im 32/64]
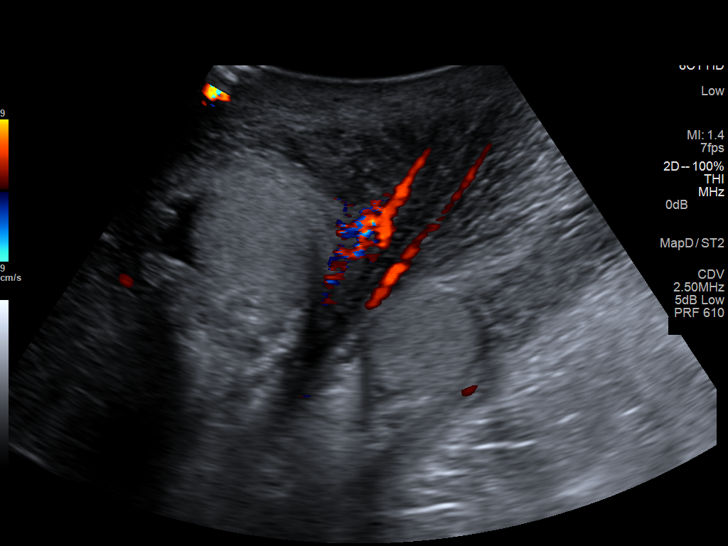
[im 37/64]
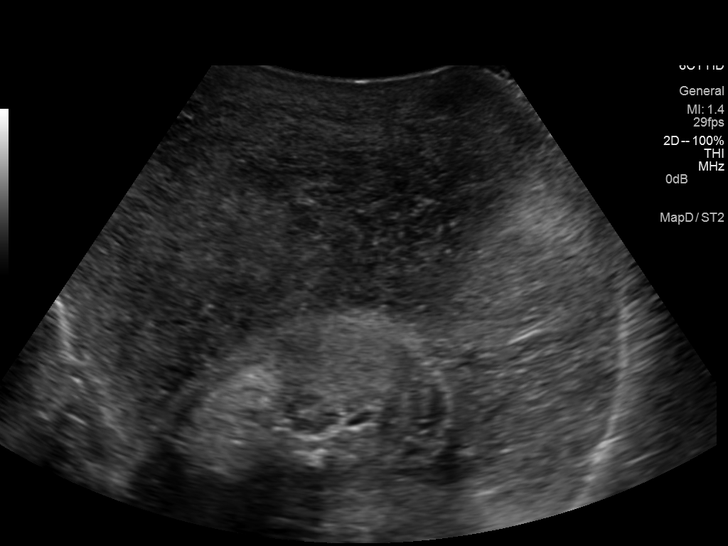
[im 43/64]
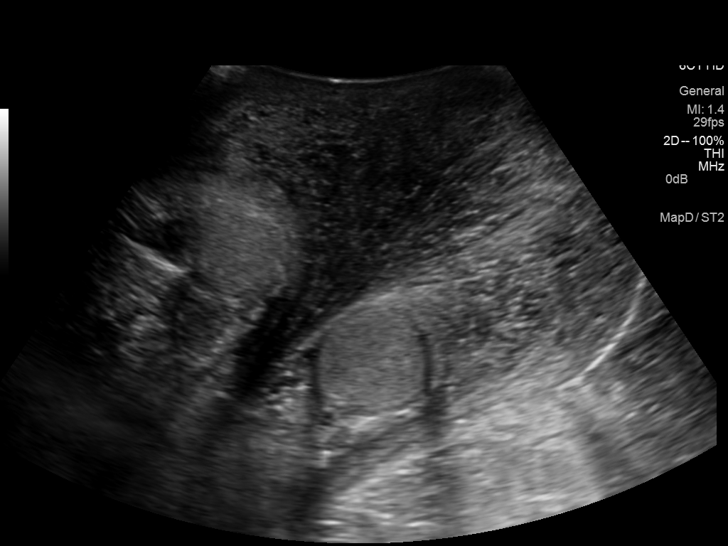
[im 48/64]
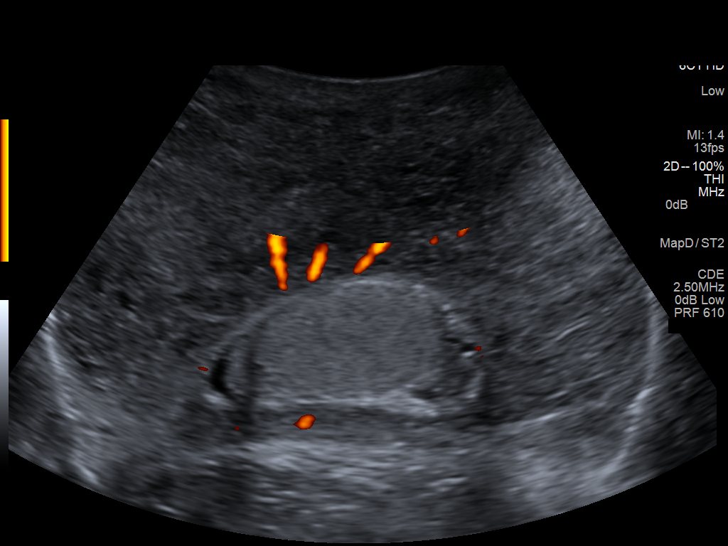
[im 53/64]
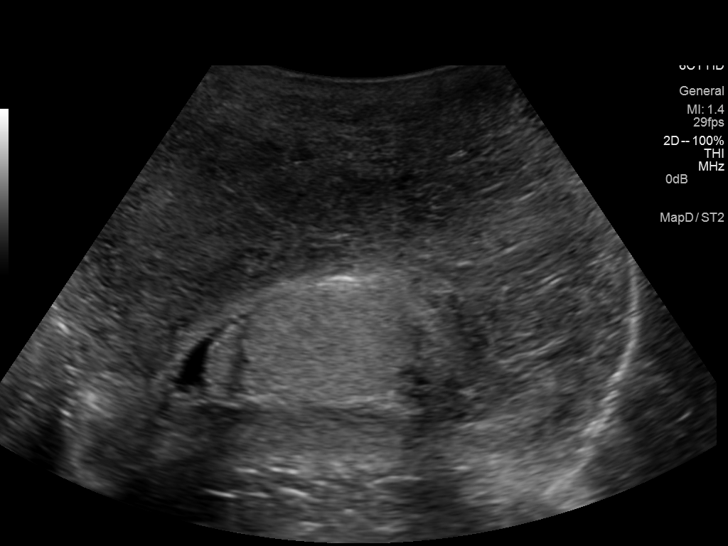
[im 58/64]
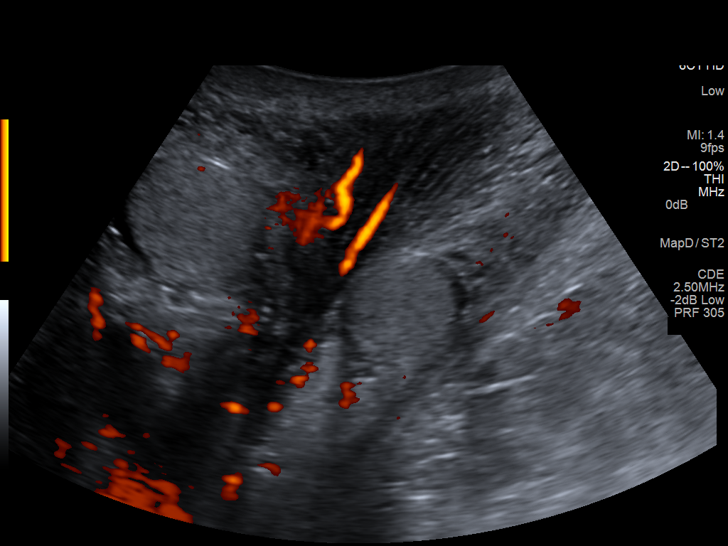
[im 64/64]
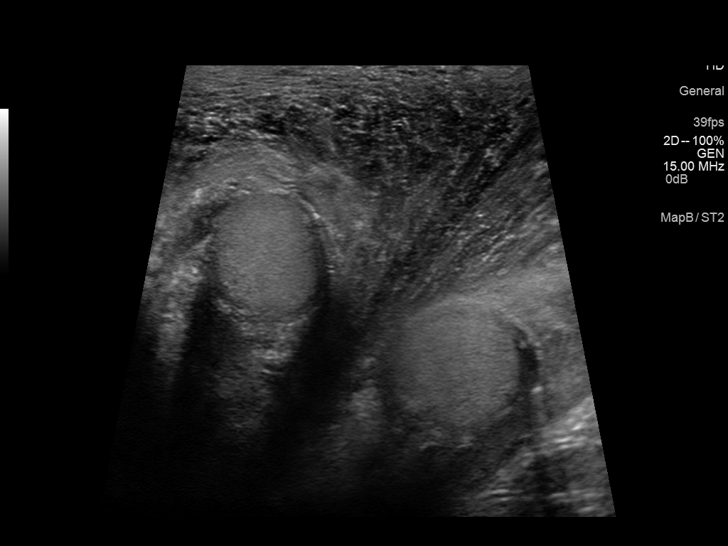

[13 of 25 positions shown; findings below may reference images not displayed]

FINDINGS: Right testicle

Measurements: 3.5 x 2.3 x 2.5 cm. No mass or microlithiasis
visualized.

Left testicle

Measurements: 4.0 x 2.5 x 2.5 cm. No mass or microlithiasis
visualized.

Right epididymis: Normal in size and appearance. Small right
epididymal head cyst measured 3 x 3 mm.

Left epididymis:  Normal in size and appearance.

Hydrocele:  A small right hydrocele was present.

Varicocele:  None visualized.

Pulsed Doppler interrogation of both testes demonstrates low
resistance arterial and venous waveforms bilaterally.

Marked tube skin thickening and edema seen throughout the soft
tissues of the scrotum. No loculated fluid collection identified.
IMPRESSION: 1. No sonographic evidence of testicular torsion or other
abnormality.
2. Marked scrotal swelling and edema. Differential considerations
include cellulitis or possibly swelling related to volume status/
anasarca. Clinical correlation recommended. No loculated fluid
collection identified.
3. Small right hydrocele.
